# Patient Record
Sex: Female | Born: 1945 | Race: White | Hispanic: No | Marital: Married | Smoking: Never smoker
Health system: Southern US, Community
[De-identification: ages and names within clinical notes are randomized; demographics above are authoritative.]

## PROBLEM LIST (undated history)

## (undated) DIAGNOSIS — C786 Secondary malignant neoplasm of retroperitoneum and peritoneum: Secondary | ICD-10-CM

## (undated) DIAGNOSIS — I8390 Asymptomatic varicose veins of unspecified lower extremity: Secondary | ICD-10-CM

## (undated) HISTORY — PX: ABDOMINAL HYSTERECTOMY: SHX81

## (undated) HISTORY — PX: COLECTOMY: SHX59

## (undated) HISTORY — DX: Secondary malignant neoplasm of retroperitoneum and peritoneum: C78.6

## (undated) HISTORY — PX: APPENDECTOMY: SHX54

## (undated) HISTORY — DX: Asymptomatic varicose veins of unspecified lower extremity: I83.90

---

## 2004-10-06 ENCOUNTER — Ambulatory Visit: Payer: Self-pay | Admitting: Oncology

## 2004-10-26 ENCOUNTER — Encounter: Admission: RE | Admit: 2004-10-26 | Discharge: 2004-10-26 | Payer: Self-pay | Admitting: Oncology

## 2004-10-28 ENCOUNTER — Ambulatory Visit (HOSPITAL_COMMUNITY): Admission: RE | Admit: 2004-10-28 | Discharge: 2004-10-28 | Payer: Self-pay | Admitting: Oncology

## 2005-01-25 ENCOUNTER — Ambulatory Visit (HOSPITAL_BASED_OUTPATIENT_CLINIC_OR_DEPARTMENT_OTHER): Admission: RE | Admit: 2005-01-25 | Discharge: 2005-01-25 | Payer: Self-pay | Admitting: Orthopedic Surgery

## 2005-01-29 ENCOUNTER — Ambulatory Visit: Payer: Self-pay | Admitting: Oncology

## 2005-06-08 ENCOUNTER — Ambulatory Visit: Payer: Self-pay | Admitting: Oncology

## 2005-08-06 ENCOUNTER — Ambulatory Visit: Payer: Self-pay | Admitting: Oncology

## 2005-10-26 ENCOUNTER — Ambulatory Visit: Payer: Self-pay | Admitting: Oncology

## 2005-10-28 ENCOUNTER — Encounter: Admission: RE | Admit: 2005-10-28 | Discharge: 2005-10-28 | Payer: Self-pay | Admitting: Oncology

## 2006-02-11 ENCOUNTER — Ambulatory Visit: Payer: Self-pay | Admitting: Oncology

## 2006-02-15 LAB — COMPREHENSIVE METABOLIC PANEL
Alkaline Phosphatase: 37 U/L — ABNORMAL LOW (ref 39–117)
CO2: 25 mEq/L (ref 19–32)
Creatinine, Ser: 0.9 mg/dL (ref 0.4–1.2)
Glucose, Bld: 85 mg/dL (ref 70–99)
Sodium: 142 mEq/L (ref 135–145)
Total Bilirubin: 0.6 mg/dL (ref 0.3–1.2)

## 2006-02-15 LAB — CEA: CEA: 0.7 ng/mL (ref 0.0–5.0)

## 2006-02-15 LAB — LACTATE DEHYDROGENASE: LDH: 134 U/L (ref 94–250)

## 2006-05-03 ENCOUNTER — Ambulatory Visit: Payer: Self-pay | Admitting: Oncology

## 2006-05-10 LAB — COMPREHENSIVE METABOLIC PANEL
AST: 15 U/L (ref 0–37)
Albumin: 4.2 g/dL (ref 3.5–5.2)
Alkaline Phosphatase: 36 U/L — ABNORMAL LOW (ref 39–117)
BUN: 21 mg/dL (ref 6–23)
Potassium: 4.3 mEq/L (ref 3.5–5.3)
Sodium: 141 mEq/L (ref 135–145)
Total Protein: 6.6 g/dL (ref 6.0–8.3)

## 2006-05-10 LAB — CBC WITH DIFFERENTIAL/PLATELET
EOS%: 2.4 % (ref 0.0–7.0)
MCH: 33.4 pg (ref 26.0–34.0)
MCHC: 34.5 g/dL (ref 32.0–36.0)
MCV: 96.7 fL (ref 81.0–101.0)
MONO%: 8.7 % (ref 0.0–13.0)
RBC: 3.9 10*6/uL (ref 3.70–5.32)
RDW: 13 % (ref 11.3–14.5)

## 2006-05-10 LAB — LACTATE DEHYDROGENASE: LDH: 120 U/L (ref 94–250)

## 2006-07-05 ENCOUNTER — Ambulatory Visit: Admission: RE | Admit: 2006-07-05 | Discharge: 2006-07-05 | Payer: Self-pay | Admitting: Gynecologic Oncology

## 2006-07-05 ENCOUNTER — Other Ambulatory Visit: Admission: RE | Admit: 2006-07-05 | Discharge: 2006-07-05 | Payer: Self-pay | Admitting: Gynecologic Oncology

## 2006-07-05 ENCOUNTER — Encounter (INDEPENDENT_AMBULATORY_CARE_PROVIDER_SITE_OTHER): Payer: Self-pay | Admitting: Specialist

## 2006-10-04 LAB — HM COLONOSCOPY

## 2006-11-09 ENCOUNTER — Ambulatory Visit: Payer: Self-pay | Admitting: Oncology

## 2006-11-14 LAB — CBC WITH DIFFERENTIAL/PLATELET
Basophils Absolute: 0 10*3/uL (ref 0.0–0.1)
Eosinophils Absolute: 0.1 10*3/uL (ref 0.0–0.5)
HCT: 39 % (ref 34.8–46.6)
HGB: 13.9 g/dL (ref 11.6–15.9)
LYMPH%: 28.1 % (ref 14.0–48.0)
MONO#: 0.4 10*3/uL (ref 0.1–0.9)
NEUT%: 61.8 % (ref 39.6–76.8)
Platelets: 217 10*3/uL (ref 145–400)
WBC: 5.5 10*3/uL (ref 3.9–10.0)
lymph#: 1.5 10*3/uL (ref 0.9–3.3)

## 2006-11-15 LAB — CEA: CEA: 0.8 ng/mL (ref 0.0–5.0)

## 2006-11-15 LAB — COMPREHENSIVE METABOLIC PANEL
ALT: 13 U/L (ref 0–35)
BUN: 22 mg/dL (ref 6–23)
CO2: 26 mEq/L (ref 19–32)
Calcium: 9.1 mg/dL (ref 8.4–10.5)
Chloride: 108 mEq/L (ref 96–112)
Creatinine, Ser: 1.01 mg/dL (ref 0.40–1.20)
Glucose, Bld: 102 mg/dL — ABNORMAL HIGH (ref 70–99)
Total Bilirubin: 0.7 mg/dL (ref 0.3–1.2)

## 2006-11-15 LAB — LACTATE DEHYDROGENASE: LDH: 128 U/L (ref 94–250)

## 2006-12-21 ENCOUNTER — Encounter: Admission: RE | Admit: 2006-12-21 | Discharge: 2006-12-21 | Payer: Self-pay | Admitting: Oncology

## 2007-05-11 ENCOUNTER — Ambulatory Visit: Payer: Self-pay | Admitting: Oncology

## 2007-05-15 LAB — CEA: CEA: <0.5 ng/mL (ref 0.0–5.0)

## 2007-05-15 LAB — COMPREHENSIVE METABOLIC PANEL
ALT: 11 U/L (ref 0–35)
Albumin: 4.3 g/dL (ref 3.5–5.2)
CO2: 27 mEq/L (ref 19–32)
Calcium: 9.2 mg/dL (ref 8.4–10.5)
Chloride: 105 mEq/L (ref 96–112)
Potassium: 3.9 mEq/L (ref 3.5–5.3)
Sodium: 140 mEq/L (ref 135–145)
Total Protein: 6.8 g/dL (ref 6.0–8.3)

## 2007-05-15 LAB — CBC WITH DIFFERENTIAL/PLATELET
BASO%: 0.5 % (ref 0.0–2.0)
HCT: 37.2 % (ref 34.8–46.6)
MCHC: 35.5 g/dL (ref 32.0–36.0)
MONO#: 0.6 10*3/uL (ref 0.1–0.9)
NEUT#: 4 10*3/uL (ref 1.5–6.5)
RBC: 3.91 10*6/uL (ref 3.70–5.32)
WBC: 6.5 10*3/uL (ref 3.9–10.0)
lymph#: 1.7 10*3/uL (ref 0.9–3.3)

## 2007-05-15 LAB — LACTATE DEHYDROGENASE: LDH: 140 U/L (ref 94–250)

## 2007-05-15 LAB — CA 125: CA 125: 5 U/mL (ref 0.0–30.2)

## 2007-08-02 ENCOUNTER — Encounter: Payer: Self-pay | Admitting: Gynecologic Oncology

## 2007-08-02 ENCOUNTER — Other Ambulatory Visit: Admission: RE | Admit: 2007-08-02 | Discharge: 2007-08-02 | Payer: Self-pay | Admitting: Gynecologic Oncology

## 2007-08-02 ENCOUNTER — Ambulatory Visit: Admission: RE | Admit: 2007-08-02 | Discharge: 2007-08-02 | Payer: Self-pay | Admitting: Gynecologic Oncology

## 2007-11-17 ENCOUNTER — Ambulatory Visit: Payer: Self-pay | Admitting: Oncology

## 2007-11-21 LAB — CBC WITH DIFFERENTIAL/PLATELET
BASO%: 0.3 % (ref 0.0–2.0)
Basophils Absolute: 0 10*3/uL (ref 0.0–0.1)
HCT: 38.5 % (ref 34.8–46.6)
HGB: 13.5 g/dL (ref 11.6–15.9)
MONO#: 0.6 10*3/uL (ref 0.1–0.9)
NEUT#: 4 10*3/uL (ref 1.5–6.5)
NEUT%: 60.2 % (ref 39.6–76.8)
WBC: 6.7 10*3/uL (ref 3.9–10.0)
lymph#: 1.9 10*3/uL (ref 0.9–3.3)

## 2007-11-21 LAB — COMPREHENSIVE METABOLIC PANEL
ALT: 14 U/L (ref 0–35)
BUN: 27 mg/dL — ABNORMAL HIGH (ref 6–23)
CO2: 26 mEq/L (ref 19–32)
Calcium: 9.1 mg/dL (ref 8.4–10.5)
Chloride: 104 mEq/L (ref 96–112)
Creatinine, Ser: 0.81 mg/dL (ref 0.40–1.20)

## 2007-11-21 LAB — LACTATE DEHYDROGENASE: LDH: 118 U/L (ref 94–250)

## 2007-12-25 ENCOUNTER — Encounter: Admission: RE | Admit: 2007-12-25 | Discharge: 2007-12-25 | Payer: Self-pay | Admitting: Oncology

## 2008-03-11 ENCOUNTER — Ambulatory Visit: Payer: Self-pay | Admitting: Oncology

## 2008-03-11 LAB — CBC WITH DIFFERENTIAL/PLATELET
Basophils Absolute: 0 10*3/uL (ref 0.0–0.1)
EOS%: 2.8 % (ref 0.0–7.0)
Eosinophils Absolute: 0.2 10*3/uL (ref 0.0–0.5)
HCT: 39.3 % (ref 34.8–46.6)
HGB: 13.4 g/dL (ref 11.6–15.9)
MCH: 32.5 pg (ref 26.0–34.0)
NEUT%: 59.4 % (ref 39.6–76.8)
lymph#: 1.7 10*3/uL (ref 0.9–3.3)

## 2008-03-11 LAB — COMPREHENSIVE METABOLIC PANEL
AST: 17 U/L (ref 0–37)
BUN: 26 mg/dL — ABNORMAL HIGH (ref 6–23)
CO2: 25 mEq/L (ref 19–32)
Calcium: 9.3 mg/dL (ref 8.4–10.5)
Chloride: 107 mEq/L (ref 96–112)
Creatinine, Ser: 0.97 mg/dL (ref 0.40–1.20)
Glucose, Bld: 89 mg/dL (ref 70–99)

## 2008-03-11 LAB — LACTATE DEHYDROGENASE: LDH: 135 U/L (ref 94–250)

## 2008-03-11 LAB — CEA: CEA: <0.5 ng/mL (ref 0.0–5.0)

## 2008-03-11 LAB — CA 125: CA 125: 5.3 U/mL (ref 0.0–30.2)

## 2008-03-12 ENCOUNTER — Encounter: Admission: RE | Admit: 2008-03-12 | Discharge: 2008-03-12 | Payer: Self-pay | Admitting: Oncology

## 2008-05-17 ENCOUNTER — Ambulatory Visit: Payer: Self-pay | Admitting: Oncology

## 2008-05-21 LAB — CBC WITH DIFFERENTIAL/PLATELET
Eosinophils Absolute: 0.1 10*3/uL (ref 0.0–0.5)
HCT: 38.5 % (ref 34.8–46.6)
LYMPH%: 22.2 % (ref 14.0–48.0)
MONO#: 0.6 10*3/uL (ref 0.1–0.9)
NEUT#: 4.3 10*3/uL (ref 1.5–6.5)
NEUT%: 65.5 % (ref 39.6–76.8)
Platelets: 201 10*3/uL (ref 145–400)
RBC: 4.01 10*6/uL (ref 3.70–5.32)
WBC: 6.5 10*3/uL (ref 3.9–10.0)
lymph#: 1.4 10*3/uL (ref 0.9–3.3)

## 2008-05-21 LAB — COMPREHENSIVE METABOLIC PANEL
ALT: 11 U/L (ref 0–35)
Albumin: 4.2 g/dL (ref 3.5–5.2)
CO2: 26 mEq/L (ref 19–32)
Calcium: 9.3 mg/dL (ref 8.4–10.5)
Chloride: 105 mEq/L (ref 96–112)
Glucose, Bld: 86 mg/dL (ref 70–99)
Sodium: 140 mEq/L (ref 135–145)
Total Bilirubin: 0.5 mg/dL (ref 0.3–1.2)
Total Protein: 7 g/dL (ref 6.0–8.3)

## 2008-05-21 LAB — CA 125: CA 125: 4.7 U/mL (ref 0.0–30.2)

## 2008-05-21 LAB — CEA: CEA: <0.5 ng/mL (ref 0.0–5.0)

## 2008-05-21 LAB — LACTATE DEHYDROGENASE: LDH: 145 U/L (ref 94–250)

## 2008-11-25 ENCOUNTER — Ambulatory Visit: Payer: Self-pay | Admitting: Oncology

## 2008-11-25 LAB — CBC WITH DIFFERENTIAL/PLATELET
Basophils Absolute: 0 10*3/uL (ref 0.0–0.1)
Eosinophils Absolute: 0.1 10*3/uL (ref 0.0–0.5)
LYMPH%: 30.3 % (ref 14.0–49.7)
MCH: 32.4 pg (ref 25.1–34.0)
MONO%: 9.9 % (ref 0.0–14.0)
NEUT#: 3.1 10*3/uL (ref 1.5–6.5)
Platelets: 171 10*3/uL (ref 145–400)
RBC: 4.13 10*6/uL (ref 3.70–5.45)
RDW: 12.3 % (ref 11.2–14.5)
WBC: 5.5 10*3/uL (ref 3.9–10.3)
lymph#: 1.7 10*3/uL (ref 0.9–3.3)

## 2008-11-25 LAB — COMPREHENSIVE METABOLIC PANEL
AST: 19 U/L (ref 0–37)
CO2: 30 mEq/L (ref 19–32)
Calcium: 9 mg/dL (ref 8.4–10.5)
Glucose, Bld: 99 mg/dL (ref 70–99)
Total Protein: 7.1 g/dL (ref 6.0–8.3)

## 2008-11-25 LAB — CA 125: CA 125: 3.9 U/mL (ref 0.0–30.2)

## 2008-11-25 LAB — LACTATE DEHYDROGENASE: LDH: 109 U/L (ref 94–250)

## 2008-12-30 ENCOUNTER — Encounter: Admission: RE | Admit: 2008-12-30 | Discharge: 2008-12-30 | Payer: Self-pay | Admitting: Oncology

## 2009-01-03 ENCOUNTER — Encounter: Admission: RE | Admit: 2009-01-03 | Discharge: 2009-01-03 | Payer: Self-pay | Admitting: Oncology

## 2009-02-19 ENCOUNTER — Ambulatory Visit: Admission: RE | Admit: 2009-02-19 | Discharge: 2009-02-19 | Payer: Self-pay | Admitting: Gynecologic Oncology

## 2009-05-04 LAB — CONVERTED CEMR LAB

## 2009-05-14 ENCOUNTER — Ambulatory Visit: Payer: Self-pay | Admitting: Oncology

## 2009-05-16 LAB — COMPREHENSIVE METABOLIC PANEL
ALT: 15 U/L (ref 0–35)
Alkaline Phosphatase: 35 U/L — ABNORMAL LOW (ref 39–117)
BUN: 20 mg/dL (ref 6–23)
CO2: 25 mEq/L (ref 19–32)
Calcium: 9 mg/dL (ref 8.4–10.5)
Creatinine, Ser: 1.01 mg/dL (ref 0.40–1.20)
Potassium: 4 mEq/L (ref 3.5–5.3)
Sodium: 141 mEq/L (ref 135–145)

## 2009-05-16 LAB — CEA: CEA: 0.5 ng/mL (ref 0.0–5.0)

## 2009-05-16 LAB — CBC WITH DIFFERENTIAL/PLATELET
Eosinophils Absolute: 0.1 10*3/uL (ref 0.0–0.5)
HCT: 37.1 % (ref 34.8–46.6)
HGB: 12.9 g/dL (ref 11.6–15.9)
MCH: 33.5 pg (ref 25.1–34.0)
MCV: 96.5 fL (ref 79.5–101.0)
MONO%: 7 % (ref 0.0–14.0)
NEUT#: 3.9 10*3/uL (ref 1.5–6.5)
WBC: 6.1 10*3/uL (ref 3.9–10.3)
lymph#: 1.7 10*3/uL (ref 0.9–3.3)

## 2009-10-04 LAB — HM MAMMOGRAPHY

## 2009-12-31 ENCOUNTER — Encounter: Admission: RE | Admit: 2009-12-31 | Discharge: 2009-12-31 | Payer: Self-pay | Admitting: Oncology

## 2010-05-28 ENCOUNTER — Ambulatory Visit: Payer: Self-pay | Admitting: Oncology

## 2010-06-09 ENCOUNTER — Ambulatory Visit: Payer: Self-pay | Admitting: Family Medicine

## 2010-06-09 DIAGNOSIS — L255 Unspecified contact dermatitis due to plants, except food: Secondary | ICD-10-CM | POA: Insufficient documentation

## 2010-06-09 DIAGNOSIS — C786 Secondary malignant neoplasm of retroperitoneum and peritoneum: Secondary | ICD-10-CM

## 2010-06-09 HISTORY — DX: Secondary malignant neoplasm of retroperitoneum and peritoneum: C78.6

## 2010-06-29 ENCOUNTER — Ambulatory Visit: Payer: Self-pay | Admitting: Oncology

## 2010-07-01 ENCOUNTER — Encounter: Payer: Self-pay | Admitting: Family Medicine

## 2010-07-01 LAB — CBC WITH DIFFERENTIAL/PLATELET
EOS%: 2.6 % (ref 0.0–7.0)
Eosinophils Absolute: 0.2 10*3/uL (ref 0.0–0.5)
HCT: 37 % (ref 34.8–46.6)
HGB: 12.8 g/dL (ref 11.6–15.9)
MCV: 96.7 fL (ref 79.5–101.0)
NEUT#: 3.6 10*3/uL (ref 1.5–6.5)
Platelets: 193 10*3/uL (ref 145–400)

## 2010-07-01 LAB — COMPREHENSIVE METABOLIC PANEL
ALT: 14 U/L (ref 0–35)
Albumin: 4.1 g/dL (ref 3.5–5.2)
BUN: 18 mg/dL (ref 6–23)
Chloride: 107 mEq/L (ref 96–112)
Creatinine, Ser: 0.88 mg/dL (ref 0.40–1.20)
Glucose, Bld: 95 mg/dL (ref 70–99)
Potassium: 3.6 mEq/L (ref 3.5–5.3)
Sodium: 139 mEq/L (ref 135–145)
Total Bilirubin: 1.2 mg/dL (ref 0.3–1.2)

## 2010-07-01 LAB — CEA: CEA: 0.6 ng/mL (ref 0.0–5.0)

## 2010-07-01 LAB — CA 125: CA 125: 4.2 U/mL (ref 0.0–30.2)

## 2010-07-03 ENCOUNTER — Encounter: Payer: Self-pay | Admitting: Family Medicine

## 2010-08-19 ENCOUNTER — Ambulatory Visit
Admission: RE | Admit: 2010-08-19 | Discharge: 2010-08-19 | Payer: Self-pay | Source: Home / Self Care | Admitting: Gynecologic Oncology

## 2010-09-03 ENCOUNTER — Ambulatory Visit: Payer: Self-pay | Admitting: Internal Medicine

## 2010-09-07 ENCOUNTER — Ambulatory Visit: Payer: Self-pay | Admitting: Internal Medicine

## 2010-10-12 ENCOUNTER — Ambulatory Visit
Admission: RE | Admit: 2010-10-12 | Discharge: 2010-10-12 | Payer: Self-pay | Source: Home / Self Care | Attending: Internal Medicine | Admitting: Internal Medicine

## 2010-10-14 ENCOUNTER — Ambulatory Visit
Admission: RE | Admit: 2010-10-14 | Discharge: 2010-10-14 | Payer: Self-pay | Source: Home / Self Care | Attending: Family Medicine | Admitting: Family Medicine

## 2010-10-14 ENCOUNTER — Encounter: Payer: Self-pay | Admitting: Internal Medicine

## 2010-10-24 ENCOUNTER — Other Ambulatory Visit: Payer: Self-pay | Admitting: Oncology

## 2010-10-24 DIAGNOSIS — Z1231 Encounter for screening mammogram for malignant neoplasm of breast: Secondary | ICD-10-CM

## 2010-11-03 NOTE — Assessment & Plan Note (Signed)
Summary: NEW TO EST---POISON IVY//CCM   Vital Signs:  Patient profile:   65 year old female Menstrual status:  hysterectomy Height:      65 inches (165.10 cm) Weight:      135 pounds (61.36 kg) BMI:     22.55 O2 Sat:      97 % on Room air Temp:     97.8 degrees F (36.56 degrees C) oral Pulse rate:   66 / minute BP sitting:   110 / 68  (left arm)  Vitals Entered By: Josph Macho RMA (June 09, 2010 2:58 PM)  O2 Flow:  Room air CC: New patient to establish/ poison ivy X8 days - pt states she was seen at Urgent Care and received a shot and prednisone / CF Is Patient Diabetic? No     Menstrual Status hysterectomy Last PAP Result historical   History of Present Illness: Patient in for new patient appt. She is having a hard time getting rid of a poison ivy related rash that began 8 days ago. The first patches were on her right wrist and the right side of her face and she tried multiple over the counter remedies including Calamine, Benadryl, IvArrest and got only temporary relief. She ended up going to an Urgent Care facility and getting placed on Prednisone which she initially thought was helping but the last few nights she is waking up scratching with new patches on b/l flanks, arms and legs. The patch on her face has improved. She initially had been working in her yard but denies being out to work again. She tried to change her bedding and clean and to no avail. No fevers/chills/recent illness/congestion/CP/palp/SOB/GI or GU c/o.  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 She was first diagnosed in [place] where she underwent TAH b/l SPO, appy and partial colon resection. She has been cancer free since the diagnosis. She feels well and she had her last colonoscopy 3 years ago which she reports was normal. She sees Dr Si Gaul of GYN Oncology for her annual pelvic  and MGM and is scheduled in october for her exam. She had a bone scan once several years ago she reports was completely normal.  Preventive Screening-Counseling & Management  Alcohol-Tobacco     Smoking Status: never      Drug Use:  no.    Current Medications (verified): 1)  Prednisone 10 Mg Tabs (Prednisone) .... 3 Tabs Daily 2)  Climara 0.025 Mg/24hr Ptwk (Estradiol) .Marland Kitchen.. 1 Weekly 3)  Benadryl .... As Needed For Poison Ivy  Allergies (verified): 1)  ! Penicillin  Past History:  Past Surgical History: Appendectomy all 7 years ago for Cancer Colectomy Hysterectomy  Family History: Father: deceased 68, Lung Cancer, CAD, smoker Mother: 26, thyroid disease, macular degeneration, OA Siblings:  Brother: 73, CAD, former smoker Sister: 32, thyroid disease, goiter MGM: deceased in 30s, colon CA, OA MGF: deceased in late 34s, cancer PGM: deceased in 50s PGF: deceased in 60, MI Children: Son: 56, allergies  Social History: Occupation: part Dealer of events at [employer] Married Never Smoked Alcohol use-yes Drug use-no Wears seat belt regularlyOccupation:  employed Smoking Status:  never Drug Use:  no  Physical Exam  General:  Well-developed,well-nourished,in no acute distress; alert,appropriate and cooperative throughout examination Head:  Normocephalic and atraumatic without obvious abnormalities. No apparent alopecia or balding. Eyes:  No corneal or conjunctival inflammation noted. EOMI. Perrla. Funduscopic exam benign, without hemorrhages, exudates or papilledema. Vision grossly normal. Ears:  External ear exam shows no significant lesions or deformities.  Otoscopic examination reveals clear canals, tympanic membranes are intact bilaterally without bulging, retraction, inflammation or discharge. Hearing is grossly normal bilaterally. Nose:  External nasal examination shows no deformity or inflammation. Nasal mucosa are pink and moist without  lesions or exudates. Mouth:  Oral mucosa and oropharynx without lesions or exudates.  Teeth in good repair. Neck:  No deformities, masses, or tenderness noted. Lungs:  Normal respiratory effort, chest expands symmetrically. Lungs are clear to auscultation, no crackles or wheezes. Heart:  Normal rate and regular rhythm. S1 and S2 normal without gallop, murmur, click, rub or other extra sounds. Abdomen:  Bowel sounds positive,abdomen soft and non-tender without masses, organomegaly or hernias noted. Msk:  No deformity or scoliosis noted of thoracic or lumbar spine.   Pulses:  R and L carotid, dorsalis pedis and posterior tibial pulses are full and equal bilaterally Extremities:  No clubbing, cyanosis, edema, or deformity noted  Neurologic:  No cranial nerve deficits noted. Station and gait are normal. Plantar reflexes are down-going bilaterally. DTRs are symmetrical throughout. Sensory, motor and coordinative functions appear intact. Skin:  3 cm raised erythematous patch on right wrist, scattered smaller patches on right side of face, inner,  upper left thigh. Several brown seborrheic patches on her back, no black or irritated lesions noted Cervical Nodes:  No lymphadenopathy noted Psych:  Cognition and judgment appear intact. Alert and cooperative with normal attention span and concentration. No apparent delusions, illusions, hallucinations   Impression & Recommendations:  Problem # 1:  CONTACT DERMATITIS DUE TO POISON IVY (ICD-692.6)  Her updated medication list for this problem includes:    Prednisone 10 Mg Tabs (Prednisone) .Marland KitchenMarland KitchenMarland KitchenMarland Kitchen 3 tabs daily    Cetirizine Hcl 10 Mg Tabs (Cetirizine hcl) .Marland Kitchen... 1 tab by mouth two times a day as needed dermititis    Prednisone 5 Mg Tabs (Prednisone) .Marland Kitchen... 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 Takes the flu shot at church.  script given for her to have fasting labs done and sent to [place] when she does her labs for oncology and will repeat them with next years annual exam  Complete Medication List: 1)  Prednisone 10 Mg Tabs (Prednisone) .... 3 tabs daily 2)  Climara 0.025 Mg/24hr Ptwk (Estradiol) .Marland KitchenMarland KitchenMarland Kitchen 1 weekly 3)  Benadryl  .... As needed for poison ivy 4)  Cetirizine Hcl 10 Mg Tabs (Cetirizine hcl) .Marland Kitchen.. 1 tab by mouth two times a day as needed dermititis 5)  Prednisone 5 Mg Tabs (Prednisone) .Marland Kitchen.. 1 tab by mouth once daily x 3d then 1/2 tab by mouth once daily x 3 days use after course of 10mg  tabs completed  Patient Instructions: 1)  Please schedule a follow-up appointment in 1 year for annual exam 2)  or as needed for Shingles shot, if rash does not resolve or any  other concerns. 3)  For Dermititis, try [employer] as needed, add Distilled White Vinegar to laundry and may wipe down the dog as well. Clean environment including car and cut nails. Add Prednisone 5mg  tabs to end of 10mg  course. 4)  Need TSH, CBC, liver, renal, FLP with labs in fall ordered by oncology and again prior to next year's appt. May call for order prior to next years appt Prescriptions: PREDNISONE 5 MG TABS (PREDNISONE) 1 tab by mouth once daily x 3d then 1/2 tab by mouth once daily x 3 days use after course of 10mg  tabs completed  #6 x 0   Entered and Authorized by:   Danise Edge MD   Signed by:   Danise Edge MD on 06/09/2010   Method used:   Electronically to        [facility] (retail)       [address]       [place], [place]  [address]       Ph: [phone] or [phone]       Fax: [phone]   RxID:   [id number] CETIRIZINE HCL 10 MG TABS (CETIRIZINE HCL) 1 tab by mouth two times a day as needed dermititis  #60 x 2   Entered and Authorized by:   Danise Edge MD   Signed by:   Danise Edge MD on 06/09/2010   Method used:   Electronically to        [facility] (retail)       [address]       [place], [place]  [address]       Ph: [phone] or [phone]       Fax: [phone]   RxID:   [id number]   Preventive Care Screening  Mammogram:    Date:  10/04/2009    Results:  historical   Hemoccult:    Date:  05/04/2009    Results:  historical   Pap Smear:    Date:  05/04/2009    Results:  historical   Colonoscopy:    Date:  10/04/2006    Results:  historical   Bone Density:    Date:  10/04/2002    Results:  historical std dev

## 2010-11-03 NOTE — Letter (Signed)
Summary: [facility]  [facility]   Imported By: Maryln Gottron 07/23/2010 10:31:49  _____________________________________________________________________  External Attachment:    Type:   Image     Comment:   External Document

## 2010-11-03 NOTE — Assessment & Plan Note (Signed)
Summary: to complete health form/njr   Vital Signs:  Patient profile:   65 year old female Menstrual status:  hysterectomy Weight:      139 pounds Temp:     98.0 degrees F oral BP sitting:   110 / 70  (right arm) Cuff size:   regular  Vitals Entered By: Duard Brady LPN (September 03, 2010 9:22 AM) CC: form to be completed  Is Patient Diabetic? No  Vision Screening:Left eye with correction: 20 / 30 Right eye with correction: 20 / 30 Both eyes with correction: 20 / 30        Vision Entered By: Duard Brady LPN (September 03, 2010 9:32 AM)   CC:  form to be completed .  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  She has enjoyed excellent health and has no activity restrictions.  She is followed by oncology and has been clinically stable for a number of years.  She takes no chronic medications other than hormone replacement therapy  Preventive Screening-Counseling & Management  Alcohol-Tobacco     Smoking Status: never  Allergies: 1)  ! Penicillin  Physical Exam  General:  Well-developed,well-nourished,in no acute distress; alert,appropriate and cooperative throughout examination Head:  Normocephalic and atraumatic without obvious abnormalities. No apparent alopecia or balding. Eyes:  No corneal or conjunctival inflammation noted. EOMI. Perrla. Funduscopic exam benign, without hemorrhages, exudates or papilledema. Vision grossly normal. Ears:  External ear exam shows no significant lesions or deformities.  Otoscopic examination reveals clear canals, tympanic membranes are intact bilaterally without bulging, retraction, inflammation or discharge. Hearing is grossly normal bilaterally. Mouth:  Oral mucosa and oropharynx without lesions or exudates.  Teeth in good repair. Neck:  No deformities, masses, or tenderness noted. Lungs:  Normal  respiratory effort, chest expands symmetrically. Lungs are clear to auscultation, no crackles or wheezes. Heart:  Normal rate and regular rhythm. S1 and S2 normal without gallop, murmur, click, rub or other extra sounds. Abdomen:  Bowel sounds positive,abdomen soft and non-tender without masses, organomegaly or hernias noted. Msk:  No deformity or scoliosis noted of thoracic or lumbar spine.   Pulses:  R and L carotid,radial,femoral,dorsalis pedis and posterior tibial pulses are full and equal bilaterally Extremities:  No clubbing, cyanosis, edema, or deformity noted with normal full range of motion of all joints.     Impression & Recommendations:  Problem # 1:  HEALTH MAINTENANCE EXAM (ICD-V70.0)  Complete Medication List: 1)  Climara 0.025 Mg/24hr Ptwk (Estradiol) .Marland KitchenMarland KitchenMarland Kitchen 1 weekly 2)  Benadryl  .... As needed for poison ivy 3)  Cetirizine Hcl 10 Mg Tabs (Cetirizine hcl) .Marland Kitchen.. 1 tab by mouth two times a day as needed dermititis  Other Orders: TB Skin Test [id number]) Admin 1st Vaccine (86578)  Patient Instructions: 1)  Limit your Sodium (Salt). 2)  It is important that you exercise regularly at least 20 minutes 5 times a week. If you develop chest pain, have severe difficulty breathing, or feel very tired , stop exercising immediately and seek medical attention. 3)  return in 4 days to have your PPD skin test review   Orders Added: 1)  TB Skin Test [86580] 2)  Admin 1st Vaccine [90471] 3)  Est. Patient Level III [46962]   Immunization History:  Tetanus/Td Immunization History:    Tetanus/Td:  historical (11/04/2009)  Immunizations Administered:  PPD Skin Test:  Vaccine Type: PPD    Site: right forearm    Mfr: Sanofi Pasteur    Dose: 0.1 ml    Route: ID    Given by: Duard Brady LPN    Exp. Date: 08/06/2011    Lot #: [id number]    Physician counseled: yes   Immunization History:  Tetanus/Td Immunization History:    Tetanus/Td:  Historical  (11/04/2009)  Immunizations Administered:  PPD Skin Test:    Vaccine Type: PPD    Site: right forearm    Mfr: Sanofi Pasteur    Dose: 0.1 ml    Route: ID    Given by: Duard Brady LPN    Exp. Date: 08/06/2011    Lot #: [id number]    Physician counseled: yes

## 2010-11-05 NOTE — Assessment & Plan Note (Signed)
Summary: tb reading//alp  Nurse Visit   Vitals Entered By: Duard Brady LPN (October 14, 2010 10:53 AM)  Allergies: 1)  ! Penicillin  PPD Results    Date of reading: 10/14/2010    Results: < 5mm    Interpretation: negative

## 2010-11-05 NOTE — Assessment & Plan Note (Signed)
Summary: tb test//ccm  Nurse Visit   Allergies: 1)  ! Penicillin  Immunizations Administered:  PPD Skin Test:    Vaccine Type: PPD    Site: right forearm    Mfr: Sanofi Pasteur    Dose: 0.1 ml    Route: ID    Given by: Duard Brady LPN    Exp. Date: 08/06/2011    Lot #: [id number]    Physician counseled: yes  Orders Added: 1)  TB Skin Test [86580] 2)  Admin 1st Vaccine [90471]  pt was unable to return for ppd reading last month. KIK

## 2010-11-05 NOTE — Letter (Signed)
Summary: TB Skin Test  All     ,     Phone:   Fax:           TB Skin Test    [facility]    Date TB Test Placed:  ________________  L or R forearm  TB Test Placed by:  ___________________  Lot #:  __________________        Expiration Date: _____________  Date TB Test Read:  ____________________    Result ___________MM  TB Test Read by:  _______________

## 2010-11-05 NOTE — Letter (Signed)
Summary: TB Skin Test  All     ,     Phone:   Fax:           TB Skin Test    [name]    Date TB Test Placed:  ________________  L or R forearm  TB Test Placed by:  ___________________  Lot #:  __________________        Expiration Date: _____________  Date TB Test Read:  ____________________    Result ___________MM  TB Test Read by:  _______________ 

## 2011-01-04 ENCOUNTER — Ambulatory Visit: Payer: Self-pay

## 2011-01-06 ENCOUNTER — Ambulatory Visit
Admission: RE | Admit: 2011-01-06 | Discharge: 2011-01-06 | Disposition: A | Discharge status: Home / Self Care | Payer: BC Managed Care – PPO | Source: Ambulatory Visit | Attending: Oncology | Admitting: Oncology

## 2011-01-06 DIAGNOSIS — Z1231 Encounter for screening mammogram for malignant neoplasm of breast: Secondary | ICD-10-CM

## 2011-02-16 NOTE — Consult Note (Signed)
NAMEBERTHE, OLEY           ACCOUNT NO.:  [id number]   MEDICAL RECORD NO.:  [id number]          PATIENT TYPE:  OUT   LOCATION:  GYN                          FACILITY:  [facility]   PHYSICIAN:  Paola A. 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  She underwent aggressive surgical  debulking in [place] and has done well since that time with no evidence  of disease.  I last saw her in October of 2008 at which time her exam  was unremarkable.  She was seen by Dr. Cyndie Chime in February of 2010  at which time her exam was unremarkable, as was her review of systems.  In addition, her tumor markers were both negative with a CEA less than  0.5 and a CA-125 of 3.9.  She comes in today for followup.   REVIEW OF SYSTEMS:  She really offers no significant complaints.  She is  feeling well.  She did try going off her Climara for 3 months but had  significant vasomotor-type symptoms.  She would like a refill of that  today.  She denies any change in bowel or bladder habits, any nausea,  vomiting, early satiety, or abdominal bloating.  She has not been able  to acquire a new primary physician.  She and her husband have their  house on the market here and are thinking about living in [place] part  time and here in [place] part time.  She has not been exercising  as regularly she normally does and she states that is due to the cold  winter that we had.   HEALTH MAINTENANCE:  She is up to date on her mammogram.  She had a  colonoscopy 3 years ago.   MEDICATIONS:  Climara.   FAMILY HISTORY:  There are no new medical problems.   PHYSICAL EXAMINATION:  Weight 132 pounds.  Blood pressure 100/62.  Well-  nourished,  well-developed female in no acute distress.  NECK:  Supple.  There is no lymphadenopathy, no thyromegaly.  LUNGS:  Clear to auscultation bilaterally.  CARDIOVASCULAR:  Regular rate and rhythm.  ABDOMEN:  Shows a well-healed vertical midline incision.  There are no  incisional hernias.  Abdomen is soft, nontender, nondistended.  There  are no palpable masses or hepatosplenomegaly.  Groins are negative for  adenopathy.  EXTREMITIES:  There is no edema.  PELVIC:  External genitalia is within normal limits.  Bimanual  examination reveals no masses or nodularity.  Rectal confirms.   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   PLAN:  1. I wrote her for Climara 0.025.  Said she could start weaning      herself down.  She will call us if this does not      help with her vasomotor symptoms.  2. She will continue following up with Dr. Cyndie Chime as she is      doing.  She will return to see [place] in 1 year or p.r.n.  She was      given names of other primary care physicians.      Paola A. Duard Brady, MD  Electronically Signed     PAG/MEDQ  D:  02/19/2009  T:  02/19/2009  Job:  [id number]   cc:   Telford Nab, R.N.  [address]  [place], [place] [address]   Genene Churn. Cyndie Chime, M.D.  Fax: [phone]   Hildred Laser, M.D.  [address]  [place], [place] [address]

## 2011-02-16 NOTE — Consult Note (Signed)
[name], [name]           ACCOUNT NO.:  [id number]   MEDICAL RECORD NO.:  [id number]          PATIENT TYPE:  OUT   LOCATION:  GYN                          FACILITY:  [facility]   PHYSICIAN:  Paola A. Duard Brady, MD    DATE OF BIRTH:  [birth year]   DATE OF CONSULTATION:  08/02/2007  DATE OF DISCHARGE:                                 CONSULTATION   The patient is a 65 year old with a very interesting past medical  history.  In June 2004 she underwent TAH-BSO and  omentectomy and at  that time was noted to have mucinous ascites, a large 10-cm ovarian  mass, mucoid lesions on the ovaries.  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  Preoperative  tumor markers were elevated with the elevated CA-125 of 45.  She has  been followed by Dr. Cyndie Chime on a regular basis with negative CT  scans and PET scans. She has had no evidence of recurrent disease.  We  last saw her in October 2007 at which time her exam was negative as was  her Pap smear.  She comes in today for follow-up.  She was seen by Dr.  Cyndie Chime in August 2008 at which time her exam was also negative. In  his notes, he states that her CEA and CA-125 have been normal and he has  not been doing routine CAT scans.  She otherwise denies any complaints.   REVIEW OF SYSTEMS:  She denies any chest pain, shortness of breath,  nausea, vomiting, fevers, chills, headaches or visual changes.  She  denies any significant change in her bowel or bladder habits, any early  satiety, abdominal bloating, chest pain or shortness of breath.  She did  go off her Climara patch 3 weeks ago just to try it. She states she has  been on it for 10 years and wanted to see how she would feel. In the  past when she went off of it she would suffer from significant vasomotor  symptoms that is not occurring this time and she has been off of it  for  3 weeks. Medication list is reviewed and is unchanged.   FAMILY HISTORY:  There is no new medical problems.   HEALTH MAINTENANCE:  She is up-to-date on her mammograms.   PHYSICAL EXAMINATION:  Well-nourished, well-developed female in no acute  distress.  NECK:  Supple. There is no lymphadenopathy, no thyromegaly.  LUNGS:  Clear to auscultation bilaterally.  CARDIOVASCULAR:  Regular rate and rhythm.  BREASTS:  Symmetrical.  There is no palpable masses, no skin changes, no  nipple discharge and no axillary adenopathy.  ABDOMEN:  She has a well-healed vertical skin incision. There is no  evidence of an incisional hernia. Abdominal exam reveals no masses or  nodularity. There is no hepatosplenomegaly or fluid wave.  GROINS:  Negative for adenopathy.  EXTREMITIES:  There is no edema.  PELVIC:  External genitalia is within normal limits.  The vagina is  somewhat atrophic.  The vaginal cuff is visualized. There are no visible  lesions.  A ThinPrep Pap was done without difficulty.  Bimanual  examination reveals no masses or nodularity.  Rectal confirms.   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   PLAN:  Will followup the results of her Pap smear.  She will continue  her routine mammogram screening.  She will see Dr. Cyndie Chime as  scheduled and return to see [place] in 1 year or  p.r.n.      Paola A. Duard Brady, MD  Electronically Signed     PAG/MEDQ  D:  08/02/2007  T:  08/03/2007  Job:  [id number]

## 2011-02-19 NOTE — Op Note (Signed)
NAMEGIANNI, [name]           ACCOUNT NO.:  [id number]   MEDICAL RECORD NO.:  [id number]          PATIENT TYPE:  AMB   LOCATION:  [facility]                         FACILITY:  [facility]   PHYSICIAN:  Marlowe Kays, M.D.  DATE OF BIRTH:  [birth year]   DATE OF PROCEDURE:  01/25/2005  DATE OF DISCHARGE:                                 OPERATIVE REPORT   PREOPERATIVE DIAGNOSES:  1.  Torn medial meniscus.  2.  Possible tear, anterior cruciate ligament.  3.  Chondromalacia of the patella, left knee.   POSTOPERATIVE DIAGNOSES:  1.  Torn medial meniscus.  2.  Partial anterior cruciate ligament tear.  3.  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  4.  Middle shelf plica, left knee.   OPERATION:  1.  Left knee arthroscopy with one partial medial meniscectomy.  2.  Shaving of medial femoral condyle.  3.  Debridement of patella.  4.  Excision of medium shelf plica.   SURGEON:  Marlowe Kays, M.D.   ASSISTANT:  Nurse.   ANESTHESIA:  General.   INDICATIONS FOR PROCEDURE:  She injured her knee on January 13, 2005 playing  tennis.  MRI was performed on January 18, 2005, indicating torn medical  meniscus, probably CL tear and chondromalacia of the patella.  She is here  today for surgical treatment because of the above findings.   PROCEDURE:  Satisfactory general anesthesia.  Pneumonic tourniquet.  The leg  was esmarched out nonsterilely.  Thigh stabilizer.  Ace wrap and knee  protector on the right leg.  The left leg was prepped from thigh stabilizer  to the ankle with Duraprep and draped in a sterile field.  Superior medial  saline inflow.  First, the anterolateral portal of the medial compartment of  the knee joint was evaluated.  She was noted to have grade 2/4  chondromalacia of the posterior portion of the medial femoral condyle, which  I debrided off with a combination of scissors, baskets, and a 3.5 shaver  until smooth.  She had a bucket-handle-type tear of the  posterior third of  the medial meniscus.  With a probe, I could bring the meniscus into the  joint.  After ascertaining the extent of the tear, which went all the way  into the junction of the mid posterior third, I used arthroscopic scissors  to cut the meniscus tear through at this point and then piece-mealed it out  with baskets and a 3.5 shaver into the intercondylar area.  This left  remaining rim, which was stable on probing.  Final pictures were taken.  I  then looked at the medial gutter and suprapatellar area and found a large  medial shelf plica as well as a grade 2-3 chondromalacia of the mid patella,  which I shaved down until smooth.  I then reversed portals.  There were some  soft tissue projections extending from the ACL into the intercondylar area,  which I resected.  The lateral meniscus and joint appear to be normal.  Her  ACL on first glance appeared to be completely intact with a negative  anterior drawer sign.  On  probing, there did appear to be some partial  detachment from the femur, but it did appear also to be partially intact.  It is not felt that any debridement or further treatment was indicated.  The  knee joint was then irrigated until clear, and all fluid possible removed.  The two anterior portals were closed with 4-0 nylon.  Then 20 cc of 0.5%  Marcaine with Adrenaline and 4 mg of morphine were then distilled through  the inflow apparatus, which was removed, and this portal closed with 4-0  nylon as well.  Betadine Adaptic and dry sterile dressing were applied.  The  tourniquet was released.  She tolerated the procedure well and was taken to  the recovery room in satisfactory condition with no known complications.      JA/MEDQ  D:  01/25/2005  T:  01/25/2005  Job:  [id number]

## 2011-02-19 NOTE — Consult Note (Signed)
[name], [name]           ACCOUNT NO.:  [id number]   MEDICAL RECORD NO.:  [id number]          PATIENT TYPE:  OUT   LOCATION:  GYN                          FACILITY:  [facility]   PHYSICIAN:  Paola A. Duard Brady, MD    DATE OF BIRTH:  September 04, 1946   DATE OF CONSULTATION:  07/05/2006  DATE OF DISCHARGE:                                   CONSULTATION   REFERRING PHYSICIAN:  Genene Churn. Cyndie Chime, M.D.   The patient is seen today in consultation at the request of Dr. Cyndie Chime.  [name] is a 65 year old, gravida 1 para 1, who relocated from  [place].  In June 2004, she underwent TAH-BSO with omentectomy.  She at that  time was noted to have mucinous ascites, a large 10-cm ovarian mass, mucoid  lesions were noted on both ovaries.  The omentum was resected and she  underwent radical bilateral pelvic lymphadenectomy.  She has mucinous  lesions and implants noted in the right upper abdominal quadrant near the  right diaphragm, along the small bowel, and an area of the cul-de-sac on the  bladder.  A peritoneotomy was performed and all mucinous lesions were  removed.  The appendix appeared pathologic making mucinous fluid from its  tip and she underwent a right hemicolectomy with primary anastomosis.  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  Preoperative tumor  markers revealed a CA-125 of 45.  Since that time her tumor markers have all  been normal including her CEA and CA-125.  She has been followed by Dr.  Cyndie Chime on a regular basis with negative CT scans and PET scans.  The  patient had been following up with her doctor, Dr. Adela Glimpse, in [place] but  most recently she went down to see him and he was called for emergency  surgery, so she decided that she has relocated to this area that she should  relocate her physician, though she feels quite attached to her physician in  [place].  She did  have tumor markers when she was seen by Dr. Cyndie Chime in  August, she had a CEA of 0.8 and a CA-125 of 4.3.  She comes in today for  initial consultation visit.  She is overall doing quite well and denies any  complaints whatsoever.   PAST MEDICAL HISTORY:  As above.   MEDICATIONS:  Climara 0.5 patch.   ALLERGIES:  NONE.   PAST SURGICAL HISTORY:  As above.   PAST OBSTETRICAL HISTORY:  She is a gravida 1 para 1.  She had 1 spontaneous  vaginal delivery.   HEALTH MAINTENANCE:  She is up to date on her mammogram.  She is due in  January 2003.  She had a colonoscopy 3 years ago and they have recommended  every 5 year colonoscopies for her.   FAMILY HISTORY:  Her mother had macular degeneration.  Her father died at  the age of 19 with lung cancer.   PHYSICAL EXAMINATION:  VITAL SIGNS:  Weight 126 pounds, blood pressure  110/68, pulse 72.  GENERAL:  Well nourished, well developed  female in no acute distress.  NECK:  Supple.  There is no lymphadenopathy.  No thyromegaly.  LUNGS:  Clear to auscultation bilaterally.  CARDIOVASCULAR:  Regular rate and rhythm.  ABDOMEN:  Shows a well healed vertical skin incision.  The abdomen is soft,  nontender, nondistended.  There are no palpable masses or  hepatosplenomegaly.  Groins are negative for adenopathy.  EXTREMITIES:  There is no edema.  PELVIC:  External genitalia is within normal limits.  The vagina is  atrophic.  The vaginal cuff is visualized.  There are no visible lesions.  A  thin prep Pap was submitted without difficulty.  Bimanual examination  reveals no masses or nodularity.  Rectal confirms.   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   PLAN:  1. I think it would be reasonable for her to be seen by our service, once      a year.  She will continue her primary care though  however, with Dr.      Cyndie Chime.  He will see her a bi-yearly basis.  We will also assist      her in identifying a primary care physician in the area.  She was given      our card.  She knows that she can contact us in the future should the      need arise prior to her next appointment.  2. We will followup the results of her Pap smear and notify her of the      report.      Paola A. Duard Brady, MD  Electronically Signed     PAG/MEDQ  D:  07/05/2006  T:  07/06/2006  Job:  [id number]   cc:   Genene Churn. Cyndie Chime, M.D.  Fax: [phone]   Jolinda Croak, Dr.  Zoila Shutter [place], [place]   Barbara Cower [facility] [place], [place]   Telford Nab, R.N.  [phone] [address]  [place], [place] [address]

## 2011-04-05 ENCOUNTER — Encounter: Payer: Self-pay | Admitting: Internal Medicine

## 2011-04-06 ENCOUNTER — Ambulatory Visit: Payer: BC Managed Care – PPO | Admitting: Internal Medicine

## 2011-04-06 ENCOUNTER — Telehealth: Payer: Self-pay

## 2011-04-06 ENCOUNTER — Encounter: Payer: Self-pay | Admitting: Internal Medicine

## 2011-04-06 NOTE — Telephone Encounter (Signed)
Attempt to call [facility] for acute appt - appt made yesterday at 315pm. Called hm# - no ans no mach

## 2011-04-06 NOTE — Telephone Encounter (Signed)
Just recv'd 'call a nurse ' report - pt called and cx AM appt. KIK

## 2011-04-08 ENCOUNTER — Encounter: Payer: Self-pay | Admitting: Internal Medicine

## 2011-04-08 ENCOUNTER — Ambulatory Visit (INDEPENDENT_AMBULATORY_CARE_PROVIDER_SITE_OTHER): Payer: BC Managed Care – PPO | Admitting: Internal Medicine

## 2011-04-08 VITALS — BP 110/78 | Temp 97.8°F | Wt 132.0 lb

## 2011-04-08 DIAGNOSIS — J069 Acute upper respiratory infection, unspecified: Secondary | ICD-10-CM

## 2011-04-08 NOTE — Progress Notes (Signed)
  Subjective:    Patient ID: [name], female    DOB: 02-Mar-1946, 65 y.o.   MRN: [id number]  HPI   65 year old patient who presents with a one-week history of nasal congestion ear congestion and a sense of ringing in the ears.  No fever or purulent sinus drainage. Her chief complaint is congestion during the night and when she awakes in the morning. She has noticed some frontal sinus pressure. She has been using a nasal decongestant at bedtime. He has been using ibuprofen as well.    Review of Systems  Constitutional: Negative.   HENT: Positive for congestion, rhinorrhea and tinnitus. Negative for hearing loss, sore throat, dental problem and sinus pressure.   Eyes: Negative for pain, discharge and visual disturbance.  Respiratory: Negative for cough and shortness of breath.   Cardiovascular: Negative for chest pain, palpitations and leg swelling.  Gastrointestinal: Negative for nausea, vomiting, abdominal pain, diarrhea, constipation, blood in stool and abdominal distention.  Genitourinary: Negative for dysuria, urgency, frequency, hematuria, flank pain, vaginal bleeding, vaginal discharge, difficulty urinating, vaginal pain and pelvic pain.  Musculoskeletal: Negative for joint swelling, arthralgias and gait problem.  Skin: Negative for rash.  Neurological: Negative for dizziness, syncope, speech difficulty, weakness, numbness and headaches.  Hematological: Negative for adenopathy.  Psychiatric/Behavioral: Negative for behavioral problems, dysphoric mood and agitation. The patient is not nervous/anxious.        Objective:   Physical Exam  Constitutional: She is oriented to person, place, and time. She appears well-developed and well-nourished.  HENT:  Head: Normocephalic.  Right Ear: External ear normal.  Left Ear: External ear normal.  Mouth/Throat: Oropharynx is clear and moist.       Mild frontal sinus tenderness  Eyes: Conjunctivae and EOM are normal. Pupils are equal,  round, and reactive to light.  Neck: Normal range of motion. Neck supple. No thyromegaly present.  Cardiovascular: Normal rate, regular rhythm, normal heart sounds and intact distal pulses.   Pulmonary/Chest: Effort normal and breath sounds normal.  Abdominal: Soft. Bowel sounds are normal. She exhibits no mass. There is no tenderness.  Musculoskeletal: Normal range of motion.  Lymphadenopathy:    She has no cervical adenopathy.  Neurological: She is alert and oriented to person, place, and time.  Skin: Skin is warm and dry. No rash noted.  Psychiatric: She has a normal mood and affect. Her behavior is normal.          Assessment & Plan:    Viral URI. Will treat symptomatically

## 2011-04-08 NOTE — Patient Instructions (Signed)
Get plenty of rest, Drink lots of  clear liquids, and use Tylenol or ibuprofen for fever and discomfort.    Zutripro  1 teaspoon every 6 hours  Call or return to clinic prn if these symptoms worsen or fail to improve as anticipated.

## 2011-06-25 ENCOUNTER — Other Ambulatory Visit: Payer: Self-pay | Admitting: Oncology

## 2011-06-25 ENCOUNTER — Encounter (HOSPITAL_BASED_OUTPATIENT_CLINIC_OR_DEPARTMENT_OTHER): Payer: BC Managed Care – PPO | Admitting: Oncology

## 2011-06-25 DIAGNOSIS — C786 Secondary malignant neoplasm of retroperitoneum and peritoneum: Secondary | ICD-10-CM

## 2011-06-25 DIAGNOSIS — C181 Malignant neoplasm of appendix: Secondary | ICD-10-CM

## 2011-06-25 LAB — COMPREHENSIVE METABOLIC PANEL
Albumin: 4.2 g/dL (ref 3.5–5.2)
Alkaline Phosphatase: 41 U/L (ref 39–117)
BUN: 21 mg/dL (ref 6–23)
Glucose, Bld: 101 mg/dL — ABNORMAL HIGH (ref 70–99)
Total Bilirubin: 0.5 mg/dL (ref 0.3–1.2)

## 2011-06-25 LAB — CBC WITH DIFFERENTIAL/PLATELET
BASO%: 0.4 % (ref 0.0–2.0)
EOS%: 2.5 % (ref 0.0–7.0)
MCH: 31.8 pg (ref 25.1–34.0)
MCHC: 33.7 g/dL (ref 31.5–36.0)
MONO#: 0.5 10*3/uL (ref 0.1–0.9)
RBC: 4.06 10*6/uL (ref 3.70–5.45)
RDW: 12.7 % (ref 11.2–14.5)
WBC: 5.7 10*3/uL (ref 3.9–10.3)
lymph#: 1.7 10*3/uL (ref 0.9–3.3)
nRBC: 0 % (ref 0–0)

## 2011-06-25 LAB — CA 125: CA 125: 5.5 U/mL (ref 0.0–30.2)

## 2011-06-25 LAB — CEA: CEA: <0.5 ng/mL (ref 0.0–5.0)

## 2011-06-25 LAB — LACTATE DEHYDROGENASE: LDH: 139 U/L (ref 94–250)

## 2011-07-09 ENCOUNTER — Encounter (HOSPITAL_BASED_OUTPATIENT_CLINIC_OR_DEPARTMENT_OTHER): Payer: BC Managed Care – PPO | Admitting: Oncology

## 2011-07-09 DIAGNOSIS — C181 Malignant neoplasm of appendix: Secondary | ICD-10-CM

## 2011-07-09 DIAGNOSIS — C786 Secondary malignant neoplasm of retroperitoneum and peritoneum: Secondary | ICD-10-CM

## 2011-09-02 ENCOUNTER — Ambulatory Visit (INDEPENDENT_AMBULATORY_CARE_PROVIDER_SITE_OTHER): Payer: BC Managed Care – PPO | Admitting: Internal Medicine

## 2011-09-02 ENCOUNTER — Encounter: Payer: Self-pay | Admitting: Internal Medicine

## 2011-09-02 VITALS — BP 102/70 | HR 78 | Temp 98.2°F | Wt 134.0 lb

## 2011-09-02 DIAGNOSIS — J069 Acute upper respiratory infection, unspecified: Secondary | ICD-10-CM

## 2011-09-02 NOTE — Patient Instructions (Signed)
VIMOVO 1 twice daily  ZUTRIPRO 1 teaspoon every 6 hours for cough and congestion  Nasonex-- use daily

## 2011-09-02 NOTE — Progress Notes (Signed)
  Subjective:    Patient ID: [name], female    DOB: [birth year], 65 y.o.   MRN: [id number]  HPI  65 year old patient who presents with a four-day history of congestion and cough hoarseness fever chills and generalized myalgias. There's been no shortness or breath chest pain or purulent productive cough. Her most prominent symptom is nasal congestion. She has been using a nasal decongestant as well as a number of other over-the-counter products    Review of Systems  Constitutional: Positive for fever, chills and fatigue.  HENT: Positive for congestion, sore throat, rhinorrhea, voice change, sinus pressure and tinnitus.   Respiratory: Positive for cough. Negative for wheezing.        Objective:   Physical Exam  Constitutional: She is oriented to person, place, and time. She appears well-developed and well-nourished.  HENT:  Head: Normocephalic.  Right Ear: External ear normal.  Left Ear: External ear normal.  Mouth/Throat: Oropharynx is clear and moist. No oropharyngeal exudate (mild erythema).  Eyes: Conjunctivae and EOM are normal. Pupils are equal, round, and reactive to light.  Neck: Normal range of motion. Neck supple. No thyromegaly present.  Cardiovascular: Normal rate, regular rhythm, normal heart sounds and intact distal pulses.   Pulmonary/Chest: Effort normal and breath sounds normal. No respiratory distress. She has no wheezes. She has no rales.  Abdominal: Soft. Bowel sounds are normal. She exhibits no mass. There is no tenderness.  Musculoskeletal: Normal range of motion.  Lymphadenopathy:    She has no cervical adenopathy.  Neurological: She is alert and oriented to person, place, and time.  Skin: Skin is warm and dry. No rash noted.  Psychiatric: She has a normal mood and affect. Her behavior is normal.          Assessment & Plan:    Vital URI  We'll treat symptomatically. Samples of 5 Vimovo and zutripro dispensed. She will call if unimproved

## 2011-09-09 ENCOUNTER — Telehealth: Payer: Self-pay | Admitting: Internal Medicine

## 2011-09-09 MED ORDER — HYDROCODONE-HOMATROPINE 5-1.5 MG/5ML PO SYRP: 5.0000 mL | ORAL_SOLUTION | Freq: Four times a day (QID) | ORAL | Status: AC | PRN

## 2011-09-09 NOTE — Telephone Encounter (Signed)
ok 

## 2011-09-09 NOTE — Telephone Encounter (Signed)
Pt is still experiencing a cough and congestion and is requesting to have a refill on the cough med  [employer] [facility] [address]

## 2011-09-09 NOTE — Telephone Encounter (Signed)
Called into [facility] 

## 2011-09-09 NOTE — Telephone Encounter (Signed)
Last seen 11/29 - please advise

## 2011-11-11 ENCOUNTER — Ambulatory Visit (INDEPENDENT_AMBULATORY_CARE_PROVIDER_SITE_OTHER): Payer: BC Managed Care – PPO | Admitting: Internal Medicine

## 2011-11-11 ENCOUNTER — Encounter: Payer: Self-pay | Admitting: Internal Medicine

## 2011-11-11 VITALS — BP 104/68 | HR 70 | Temp 98.3°F | Wt 134.0 lb

## 2011-11-11 DIAGNOSIS — T148XXA Other injury of unspecified body region, initial encounter: Secondary | ICD-10-CM

## 2011-11-11 DIAGNOSIS — M25461 Effusion, right knee: Secondary | ICD-10-CM

## 2011-11-11 DIAGNOSIS — M25469 Effusion, unspecified knee: Secondary | ICD-10-CM

## 2011-11-11 NOTE — Patient Instructions (Signed)
Take Aleve 200 mg twice daily for pain or swelling  Call or return to clinic prn if these symptoms worsen or fail to improve as anticipated.   compresses to the right leg hematoma 4 times daily

## 2011-11-11 NOTE — Progress Notes (Signed)
  Subjective:    Patient ID: [name], female    DOB: [birth year], 66 y.o.   MRN: [id number]  HPI  66 year old patient who is seen today for followup. She was bit by her dog and has a persistent lump involving her right lower medial thigh. More recently he has also developed a slight right knee effusion.    Review of Systems  Musculoskeletal: Positive for joint swelling.  Skin: Positive for wound.       Objective:   Physical Exam  Musculoskeletal:       Appear to be a mild effusion involving the right knee but no other inflammatory changes  Skin:       The patient then had a 4 x 6 cm hematoma involving her medial aspect of the right lower thigh          Assessment & Plan:    Hematoma right thigh. Will apply warm compresses 4 times daily and take Aleve. Will call if this does not slowly improve over time  Suspected mild right knee effusion. If pain persists or swelling persists in spite of Aleve will refer her to orthopedics. No evidence of a septic joint

## 2012-02-16 ENCOUNTER — Other Ambulatory Visit: Payer: Self-pay | Admitting: Oncology

## 2012-02-16 DIAGNOSIS — Z1231 Encounter for screening mammogram for malignant neoplasm of breast: Secondary | ICD-10-CM

## 2012-03-22 ENCOUNTER — Ambulatory Visit
Admission: RE | Admit: 2012-03-22 | Discharge: 2012-03-22 | Disposition: A | Discharge status: Home / Self Care | Payer: BC Managed Care – PPO | Source: Ambulatory Visit | Attending: Oncology | Admitting: Oncology

## 2012-03-22 DIAGNOSIS — Z1231 Encounter for screening mammogram for malignant neoplasm of breast: Secondary | ICD-10-CM

## 2012-06-27 ENCOUNTER — Telehealth: Payer: Self-pay | Admitting: Oncology

## 2012-06-27 NOTE — Telephone Encounter (Signed)
Called pt , left message regarding appt on 07/21/12, r/s from am to PM per MD

## 2012-06-29 ENCOUNTER — Telehealth: Payer: Self-pay | Admitting: Oncology

## 2012-06-29 NOTE — Telephone Encounter (Signed)
lmonvm for pt re appt for 10/11 lb. Also confirmed 10/18 appt.

## 2012-07-14 ENCOUNTER — Other Ambulatory Visit: Payer: BC Managed Care – PPO | Admitting: Lab

## 2012-07-21 ENCOUNTER — Ambulatory Visit: Payer: BC Managed Care – PPO | Admitting: Oncology

## 2012-07-21 ENCOUNTER — Telehealth: Payer: Self-pay | Admitting: Oncology

## 2012-07-21 NOTE — Telephone Encounter (Signed)
Pt called and wants to r/s appt to January 2014 lab and MD , nurse notified

## 2012-07-26 ENCOUNTER — Telehealth: Payer: Self-pay | Admitting: *Deleted

## 2012-07-26 NOTE — Telephone Encounter (Signed)
msg left for pt that we are unable to call in refill as she has not been seen in the office since 08/2010.  She was advised to contact her primary care MD or call and make an appointment here.

## 2012-10-12 ENCOUNTER — Ambulatory Visit: Payer: BC Managed Care – PPO | Admitting: Gynecologic Oncology

## 2012-10-13 ENCOUNTER — Other Ambulatory Visit: Payer: Self-pay | Admitting: *Deleted

## 2012-10-13 ENCOUNTER — Other Ambulatory Visit (HOSPITAL_BASED_OUTPATIENT_CLINIC_OR_DEPARTMENT_OTHER): Payer: Medicare Other | Admitting: Lab

## 2012-10-13 DIAGNOSIS — C786 Secondary malignant neoplasm of retroperitoneum and peritoneum: Secondary | ICD-10-CM

## 2012-10-13 LAB — CBC WITH DIFFERENTIAL/PLATELET
Basophils Absolute: 0 10*3/uL (ref 0.0–0.1)
Eosinophils Absolute: 0.2 10*3/uL (ref 0.0–0.5)
HCT: 40.2 % (ref 34.8–46.6)
HGB: 13.2 g/dL (ref 11.6–15.9)
LYMPH%: 27.1 % (ref 14.0–49.7)
MCV: 97.5 fL (ref 79.5–101.0)
MONO#: 0.6 10*3/uL (ref 0.1–0.9)
MONO%: 8.4 % (ref 0.0–14.0)
NEUT#: 4.4 10*3/uL (ref 1.5–6.5)
NEUT%: 61.7 % (ref 38.4–76.8)
Platelets: 190 10*3/uL (ref 145–400)
WBC: 7.1 10*3/uL (ref 3.9–10.3)

## 2012-10-13 LAB — COMPREHENSIVE METABOLIC PANEL (CC13)
AST: 17 U/L (ref 5–34)
Albumin: 3.8 g/dL (ref 3.5–5.0)
BUN: 21 mg/dL (ref 7.0–26.0)
CO2: 25 mEq/L (ref 22–29)
Calcium: 9.1 mg/dL (ref 8.4–10.4)
Chloride: 105 mEq/L (ref 98–107)
Creatinine: 1 mg/dL (ref 0.6–1.1)
Potassium: 4.2 mEq/L (ref 3.5–5.1)

## 2012-10-13 LAB — LACTATE DEHYDROGENASE (CC13): LDH: 149 U/L (ref 125–245)

## 2012-10-14 LAB — CEA: CEA: <0.5 ng/mL (ref 0.0–5.0)

## 2012-10-20 ENCOUNTER — Ambulatory Visit (HOSPITAL_BASED_OUTPATIENT_CLINIC_OR_DEPARTMENT_OTHER): Payer: Medicare Other | Admitting: Oncology

## 2012-10-20 ENCOUNTER — Telehealth: Payer: Self-pay | Admitting: Oncology

## 2012-10-20 VITALS — BP 105/69 | HR 61 | Temp 97.6°F | Resp 18 | Ht 65.0 in | Wt 135.7 lb

## 2012-10-20 DIAGNOSIS — C786 Secondary malignant neoplasm of retroperitoneum and peritoneum: Secondary | ICD-10-CM

## 2012-10-20 NOTE — Telephone Encounter (Signed)
s.w. pt and advised on 6.20.14 mammo @ 2:00pm....pt ok

## 2012-10-20 NOTE — Telephone Encounter (Signed)
gv and printed appt scheduel for pt for Jan 2015.Marland KitchenMarland KitchenMarland KitchenMarland Kitchen

## 2012-10-22 ENCOUNTER — Encounter: Payer: Self-pay | Admitting: Oncology

## 2012-10-22 DIAGNOSIS — C786 Secondary malignant neoplasm of retroperitoneum and peritoneum: Secondary | ICD-10-CM

## 2012-10-22 HISTORY — DX: Secondary malignant neoplasm of retroperitoneum and peritoneum: C78.6

## 2012-10-22 NOTE — Progress Notes (Signed)
Hematology and Oncology Follow Up Visit  [name] [id number] 01/19/1946 67 y.o. 10/22/2012 10:40 AM   Principle Diagnosis: Encounter Diagnosis  Name Primary?  . SEC MALIG NEOPLASM RETROPERITONEUM&PERITONEUM Yes     Interim History:  Followup visit for this pleasant 67 year old woman with history of pseudomyxoma peritonei treated with surgery alone. She was evaluated in June of 2004 for what initially was felt to be an ovarian cyst.  She underwent a TAH/BSO with omentectomy at the [facility] in [place], [place] on 03/11/03.  Additional pathology was obvious at time of surgical exploration.  A small amount of mucinous ascites was found.  This was limited to the area of the cul-de-sac.  A large 10 cm ovarian mass was removed.  A smaller lesion was excised from the right ovary and tube.  Both of these ovaries were noted to have mucoid lesions in several areas.  Additional lesions were seen on the uterus.  The omentum was resected and appeared negative grossly.  A radical bilateral pelvic lymphadenectomy was done.  A number of mucinous lesions were seen in multiple areas in the right upper abdominal quadrant near the right diaphragm and along the small bowel and the areas in the cul-de-sac and on the bladder.  A peritoneotomy was performed.  All the mucinous lesions were meticulously identified and resected.  The appendix appeared pathologic, grossly dilated and leaking mucinous fluid from its tip.  A general surgeon was called in on the case and a right hemicolectomy was performed.  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  Thirteen pericolic lymph nodes were negative as were lymph nodes taken from the right and left pelvis.    Preop tumor markers showed mild elevation of CA-125, 45 units, normal less than 21, done 02/27/03.  Subsequent CEAs done postop were all undetectable, less than 0.5, done 04/04/03 and subsequent  levels also normal.    She has been followed by clinical exam, periodic CT scans and PET scans since diagnosis a year and a half ago, and there has been no gross evidence for recurrent disease.  Most recent CT scan report available prior to her moving to [facility] done 04/27/04 was unremarkable.  PET scan done one month after her surgery on 04/05/03 showed no focal abnormal uptake.    Ms. Bottcher remains asymptomatic at this time.  She denies any abdominal pain, distention, vaginal bleeding, or discharge.  No change in bowel habit.  She has diverticulosis and tends to be constipated.  She has done so well that I have no longer been getting routine CT scans. Last studies done through this office were done in June 2009 and remained stable with no evidence for recurrent disease. He  Medications: reviewed  Allergies:  Allergies  Allergen Reactions  . Penicillins     Review of Systems: Constitutional:  No constitutional symptoms  Respiratory: No cough or dyspnea Cardiovascular:  No chest pain or palpitations Gastrointestinal: See above Genito-Urinary: Ongoing followup with GYN oncology next appointment next month Musculoskeletal: No musculoskeletal complaints Neurologic: No headache or change in vision Skin: No skin changes Remaining ROS negative.  Physical Exam: Blood pressure 105/69, pulse 61, temperature 97.6 F (36.4 C), temperature source Oral, resp. rate 18, height 5\' 5"  (1.651 m), weight 135 lb 11.2 oz (61.553 kg). Wt Readings from Last 3 Encounters:  10/20/12 135 lb 11.2 oz (61.553 kg)  11/11/11 134 lb (60.782 kg)  09/02/11 134 lb (60.782 kg)     General appearance:  Thin pleasant, Caucasian woman HENNT: Pharynx no erythema or exudate  Lymph nodes: No adenopathy Breasts: Deferred to her gynecologist Lungs: Clear to auscultation resonant to percussion Heart: Regular rhythm no murmur Abdomen: Soft, nontender, no distention, no fluid wave, no mass, no organomegaly Extremities:  No edema, no calf tenderness Vascular: No cyanosis Neurologic: No focal deficit Skin: No rash or ecchymosis  Lab Results: Lab Results  Component Value Date   WBC 7.1 10/13/2012   HGB 13.2 10/13/2012   HCT 40.2 10/13/2012   MCV 97.5 10/13/2012   PLT 190 10/13/2012     Chemistry      Component Value Date/Time   NA 139 10/13/2012 1539   NA 142 06/25/2011 1341   NA 142 06/25/2011 1341   K 4.2 10/13/2012 1539   K 4.1 06/25/2011 1341   K 4.1 06/25/2011 1341   CL 105 10/13/2012 1539   CL 105 06/25/2011 1341   CL 105 06/25/2011 1341   CO2 25 10/13/2012 1539   CO2 25 06/25/2011 1341   CO2 25 06/25/2011 1341   BUN 21.0 10/13/2012 1539   BUN 21 06/25/2011 1341   BUN 21 06/25/2011 1341   CREATININE 1.0 10/13/2012 1539   CREATININE 0.95 06/25/2011 1341   CREATININE 0.95 06/25/2011 1341      Component Value Date/Time   CALCIUM 9.1 10/13/2012 1539   CALCIUM 9.1 06/25/2011 1341   CALCIUM 9.1 06/25/2011 1341   ALKPHOS 46 10/13/2012 1539   ALKPHOS 41 06/25/2011 1341   ALKPHOS 41 06/25/2011 1341   AST 17 10/13/2012 1539   AST 16 06/25/2011 1341   AST 16 06/25/2011 1341   ALT 8 10/13/2012 1539   ALT 9 06/25/2011 1341   ALT 9 06/25/2011 1341   BILITOT 0.60 10/13/2012 1539   BILITOT 0.5 06/25/2011 1341   BILITOT 0.5 06/25/2011 1341    CEA less than 0.5 on 10/13/2012; CA- 125-0.8 units   Radiological Studies: Most recent mammogram 03/22/2012 no pathology   Impression and Plan: #1. 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 She remains free of any obvious recurrence now out almost 10 years from diagnosis and surgery. I will continue to see her on an annual basis. She will call for any interim problems.  I will schedule her routine followup mammogram for this June. She will continue followup with Dr. Rica Records, gynecologic oncology   CC:. Dr. Rockney Ghee; Dr. Beverely Low   Levert Feinstein, MD 1/19/201410:40 AM

## 2012-11-08 ENCOUNTER — Ambulatory Visit: Payer: BC Managed Care – PPO | Admitting: Gynecologic Oncology

## 2012-11-14 ENCOUNTER — Other Ambulatory Visit (INDEPENDENT_AMBULATORY_CARE_PROVIDER_SITE_OTHER): Payer: Medicare Other

## 2012-11-14 DIAGNOSIS — Z0000 Encounter for general adult medical examination without abnormal findings: Secondary | ICD-10-CM

## 2012-11-14 DIAGNOSIS — C786 Secondary malignant neoplasm of retroperitoneum and peritoneum: Secondary | ICD-10-CM

## 2012-11-14 LAB — BASIC METABOLIC PANEL
BUN: 18 mg/dL (ref 6–23)
Chloride: 103 mEq/L (ref 96–112)
Creatinine, Ser: 1 mg/dL (ref 0.4–1.2)
GFR: 61.98 mL/min (ref >60.00)
Potassium: 4.7 mEq/L (ref 3.5–5.1)

## 2012-11-14 LAB — HEPATIC FUNCTION PANEL
Albumin: 4.2 g/dL (ref 3.5–5.2)
Bilirubin, Direct: 0 mg/dL (ref 0.0–0.3)
Total Bilirubin: 0.6 mg/dL (ref 0.3–1.2)

## 2012-11-14 LAB — LIPID PANEL
Cholesterol: 206 mg/dL — ABNORMAL HIGH (ref 0–200)
HDL: 67.8 mg/dL (ref >39.00)
Triglycerides: 76 mg/dL (ref 0.0–149.0)

## 2012-11-14 LAB — CBC WITH DIFFERENTIAL/PLATELET
Basophils Relative: 0.5 % (ref 0.0–3.0)
Eosinophils Relative: 2.2 % (ref 0.0–5.0)
Monocytes Relative: 7.5 % (ref 3.0–12.0)
Neutrophils Relative %: 63.5 % (ref 43.0–77.0)
Platelets: 191 10*3/uL (ref 150.0–400.0)
RBC: 4.37 Mil/uL (ref 3.87–5.11)
WBC: 6.3 10*3/uL (ref 4.5–10.5)

## 2012-11-14 LAB — TSH: TSH: 1.58 u[IU]/mL (ref 0.35–5.50)

## 2012-11-21 ENCOUNTER — Ambulatory Visit (INDEPENDENT_AMBULATORY_CARE_PROVIDER_SITE_OTHER): Payer: Medicare Other | Admitting: Internal Medicine

## 2012-11-21 ENCOUNTER — Encounter: Payer: Self-pay | Admitting: Internal Medicine

## 2012-11-21 VITALS — BP 100/62 | HR 54 | Temp 97.3°F | Resp 16 | Ht 64.75 in | Wt 134.0 lb

## 2012-11-21 DIAGNOSIS — Z0000 Encounter for general adult medical examination without abnormal findings: Secondary | ICD-10-CM

## 2012-11-21 DIAGNOSIS — C786 Secondary malignant neoplasm of retroperitoneum and peritoneum: Secondary | ICD-10-CM

## 2012-11-21 MED ORDER — ESTRADIOL 0.025 MG/24HR TD PTWK: 1.0000 | MEDICATED_PATCH | TRANSDERMAL | Status: DC

## 2012-11-21 NOTE — Patient Instructions (Signed)
    It is important that you exercise regularly, at least 20 minutes 3 to 4 times per week.  If you develop chest pain or shortness of breath seek  medical attention.   mammogram as scheduled  Take a calcium supplement, plus [phone] units of vitamin D

## 2012-11-21 NOTE — Progress Notes (Signed)
Subjective:    Patient ID: [name], female    DOB: [birth year], 67 y.o.   MRN: [id number]  HPI  67 year old patient who is seen today for a wellness exam. 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 She is doing quite well. She remains on hormone replacement therapy and has failed a number of attempts with discontinuation. No concerns or complaints today she did have screening colonoscopy in 2008 and is scheduled for mammogram later this spring.  1. Risk factors, based on past  M,S,F history-  cardiovascular risk factors  2.  Physical activities:   Remains quite active without exercise limitations  3.  Depression/mood: No history depression or mood disorder  4.  Hearing: No hearing deficits  5.  ADL's: Independent in all aspects of daily living  6.  Fall risk: Low  7.  Home safety: No problems identified  8.  Height weight, and visual acuity; height and weight stable no change in visual acuity. Uses contact lenses  9.  Counseling: Heart healthy diet regular exercise encouraged followup mammogram encouraged. This has been scheduled  10. Lab orders based on risk factors: Laboratory studies including lipid profile reviewed  11. Referral : Not appropriate at this time  12. Care plan: Heart healthy diet regular exercise encouraged  13. Cognitive assessment: Alert and oriented normal affect. No cognitive dysfunction       Review of Systems  Constitutional: Negative for fever, appetite change, fatigue and unexpected weight change.  HENT: Negative for hearing loss, ear pain, nosebleeds, congestion, sore throat, mouth sores, trouble swallowing, neck stiffness, dental problem, voice change, sinus pressure and tinnitus.   Eyes: Negative for photophobia, pain, redness and visual disturbance.  Respiratory: Negative for cough, chest tightness and shortness of breath.   Cardiovascular: Negative for chest pain,  palpitations and leg swelling.  Gastrointestinal: Negative for nausea, vomiting, abdominal pain, diarrhea, constipation, blood in stool, abdominal distention and rectal pain.  Genitourinary: Negative for dysuria, urgency, frequency, hematuria, flank pain, vaginal bleeding, vaginal discharge, difficulty urinating, genital sores, vaginal pain, menstrual problem and pelvic pain.  Musculoskeletal: Negative for back pain and arthralgias.  Skin: Negative for rash.  Neurological: Negative for dizziness, syncope, speech difficulty, weakness, light-headedness, numbness and headaches.  Hematological: Negative for adenopathy. Does not bruise/bleed easily.  Psychiatric/Behavioral: Negative for suicidal ideas, behavioral problems, self-injury, dysphoric mood and agitation. The patient is not nervous/anxious.        Objective:   Physical Exam  Constitutional: She is oriented to person, place, and time. She appears well-developed and well-nourished.  HENT:  Head: Normocephalic and atraumatic.  Right Ear: External ear normal.  Left Ear: External ear normal.  Mouth/Throat: Oropharynx is clear and moist.  Eyes: Conjunctivae and EOM are normal.  Neck: Normal range of motion. Neck supple. No JVD present. No thyromegaly present.  Cardiovascular: Normal rate, regular rhythm, normal heart sounds and intact distal pulses.   No murmur heard. Pulmonary/Chest: Effort normal and breath sounds normal. She has no wheezes. She has no rales.  Abdominal: Soft. Bowel sounds are normal. She exhibits no distension and no mass. There is no tenderness. There is no rebound and no guarding.  Musculoskeletal: Normal range of motion. She exhibits no edema and no tenderness.  Neurological: She is alert and oriented to person, place, and time. She has normal reflexes. No cranial nerve deficit. She exhibits normal muscle tone. Coordination normal.  Skin: Skin is warm and dry. No rash  noted.  Psychiatric: She has a normal mood and  affect. Her behavior is normal.          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

## 2013-03-23 ENCOUNTER — Ambulatory Visit
Admission: RE | Admit: 2013-03-23 | Discharge: 2013-03-23 | Disposition: A | Discharge status: Home / Self Care | Payer: Medicare Other | Source: Ambulatory Visit | Attending: Oncology | Admitting: Oncology

## 2013-03-23 ENCOUNTER — Ambulatory Visit: Payer: Medicare Other

## 2013-03-23 DIAGNOSIS — C786 Secondary malignant neoplasm of retroperitoneum and peritoneum: Secondary | ICD-10-CM

## 2013-09-07 ENCOUNTER — Encounter: Payer: Self-pay | Admitting: Internal Medicine

## 2013-09-07 ENCOUNTER — Ambulatory Visit (INDEPENDENT_AMBULATORY_CARE_PROVIDER_SITE_OTHER): Payer: Medicare Other | Admitting: Internal Medicine

## 2013-09-07 VITALS — BP 90/60 | HR 66 | Temp 98.0°F | Resp 18 | Wt 140.0 lb

## 2013-09-07 DIAGNOSIS — G5602 Carpal tunnel syndrome, left upper limb: Secondary | ICD-10-CM

## 2013-09-07 DIAGNOSIS — G5600 Carpal tunnel syndrome, unspecified upper limb: Secondary | ICD-10-CM

## 2013-09-07 MED ORDER — ESTRADIOL 0.025 MG/24HR TD PTWK: 0.0250 mg | MEDICATED_PATCH | TRANSDERMAL | Status: DC

## 2013-09-07 NOTE — Progress Notes (Signed)
Subjective:    Patient ID: [name], female    DOB: Nov 25, 1945, 67 y.o.   MRN: [id number]  HPI Pre-visit discussion using our clinic review tool. No additional management support is needed unless otherwise documented below in the visit note.  67 year old patient who presents with a ten-day history of tingling involving both hands left much greater than the right. She is left-handed and does spend much time at the computer and is also an avid Armed forces operational officer. She also describes some left neck and shoulder discomfort. She states that 12 years ago she did have what was felt to be a cervical radiculopathy and surgery was actually encouraged. She received chiropractic care and has done well.  Past Medical History  Diagnosis Date  . 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 Marital Status: Married    Spouse Name: N/A    Number of Children: N/A  . Years of Education: N/A   Occupational History  . Not on file.   Social History Main Topics  . Smoking status: Never Smoker   . Smokeless tobacco: Never Used  . Alcohol Use: Yes  . Drug Use: No  . Sexual Activity: Not on file   Other Topics Concern  . Not on file   Social History Narrative  . No narrative on file    Past Surgical History  Procedure Laterality Date  . Appendectomy    . Abdominal hysterectomy    . Colectomy      Family History  Problem Relation Age of Onset  . Thyroid disease Mother   . Arthritis Mother   . Cancer Father     lung ca , cad, smoker  . Heart disease Father     Allergies  Allergen Reactions  . Penicillins     Current Outpatient Prescriptions on File Prior to Visit  Medication Sig Dispense Refill  . ibuprofen (ADVIL) 200 MG tablet Take 400 mg by mouth every 6 (six) hours as needed for pain.       No current facility-administered medications on file  prior to visit.    BP 90/60  Pulse 66  Temp(Src) 98 F (36.7 C) (Oral)  Resp 18  Wt 140 lb (63.504 kg)  SpO2 97%       Review of Systems  Constitutional: Negative.   HENT: Negative for congestion, dental problem, hearing loss, rhinorrhea, sinus pressure, sore throat and tinnitus.   Eyes: Negative for pain, discharge and visual disturbance.  Respiratory: Negative for cough and shortness of breath.   Cardiovascular: Negative for chest pain, palpitations and leg swelling.  Gastrointestinal: Negative for nausea, vomiting, abdominal pain, diarrhea, constipation, blood in stool and abdominal distention.  Genitourinary: Negative for dysuria, urgency, frequency, hematuria, flank pain, vaginal bleeding, vaginal discharge, difficulty urinating, vaginal pain and pelvic pain.  Musculoskeletal: Negative for arthralgias, gait problem and joint swelling.  Skin: Negative for rash.  Neurological: Positive for numbness. Negative for dizziness, syncope, speech difficulty, weakness and headaches.  Hematological: Negative for adenopathy.  Psychiatric/Behavioral: Negative for behavioral problems, dysphoric mood and agitation. The patient is not nervous/anxious.        Objective:   Physical Exam  Constitutional: She appears well-developed and well-nourished. No distress.  Neck:  Full range of motion of the head and neck  Musculoskeletal: Normal range of motion.  Positive Tinel's Normal upper extremity reflexes and muscle strength  Assessment & Plan:   Suspect bilateral carpal tunnel syndrome left greater than the right. Information dispensed. Patient will consider a nocturnal wrist splint. We'll call if unimproved

## 2013-09-07 NOTE — Patient Instructions (Signed)
Carpal Tunnel Syndrome Carpal tunnel syndrome is a disorder of the nervous system in the wrist that causes pain, hand weakness, and/or loss of feeling. Carpal tunnel syndrome is caused by the compression, stretching, or irritation of the median nerve at the wrist joint. Athletes who experience carpal tunnel syndrome may notice a decrease in their performance to the condition, especially for sports that require strong hand or wrist action.  SYMPTOMS   Tingling, numbness, or burning pain in the hand or fingers.  Inability to sleep due to pain in the hand.  Sharp pains that shoot from the wrist up the arm or to the fingers, especially at night.  Morning stiffness or cramping of the hand.  Thumb weakness, resulting in difficulty holding objects or making a fist.  Shiny, dry skin on the hand.  Reduced performance in any sport requiring a strong grip. CAUSES   Median nerve damage at the wrist is caused by pressure due to swelling, inflammation, or scarred tissue.  Sources of pressure include:  Repetitive gripping or squeezing that causes inflammation of the tendon sheaths.  Scarring or shortening of the ligament that covers the median nerve.  Traumatic injury to the wrist or forearm such as fracture, sprain, or dislocation.  Prolonged hyperextension (wrist bent backward) or hyperflexion (wrist bent downward) of the wrist. RISK INCREASES WITH:  Diabetes mellitus.  Menopause or amenorrhea.  Rheumatoid arthritis.  Raynaud's disease.  Pregnancy.  Gout.  Kidney disease.  Ganglion cyst.  Repetitive hand or wrist action.  Hypothyroidism (underactive thyroid gland).  Repetitive jolting or shaking of the hands or wrist.  Prolonged forceful weight-bearing on the hands. PREVENTION  Bracing the hand and wrist straight during activities that involve repetitive grasping.  For activities that require prolonged extension of the wrist (bending towards the top of the forearm)  periodically change the position of your wrists.  Learn and use proper technique in activities that result in the wrist position in neutral to slight extension.  Avoid bending the wrist into full extension or flexion (up or down)  Keep the wrist in a straight (neutral) position. To keep the wrist in this position, wear a splint.  Avoid repetitive hand and wrist motions.  When possible avoid prolonged grasping of items (steering wheel of a car, a pen, a vacuum cleaner, or a rake).  Loosen your grip for activities that require prolonged grasping of items.  Place keyboards and writing surfaces at the correct height as to decrease strain on the wrist and hand.  Alternate work tasks to avoid prolonged wrist flexion.  Avoid pinching activities (needlework and writing) as they may irritate your carpal tunnel syndrome.  If these activities are necessary, complete them for shorter periods of time.  When writing, use a felt tip or roller ball pen and/or build up the grip on a pen to decrease the forces required for writing. PROGNOSIS  Carpal tunnel syndrome is usually curable with appropriate conservative treatment and sometimes resolves spontaneously. For some cases, surgery is necessary, especially if muscle wasting or nerve changes have developed.  RELATED COMPLICATIONS   Permanent numbness and a weak thumb or fingers in the affected hand.  Permanent paralysis of a portion of the hand and fingers. TREATMENT  Treatment initially consists of stopping activities that aggravate the symptoms as well as medication and ice to reduce inflammation. A wrist splint is often recommended for wear during activities of repetitive motion as well as at night. It is also important to learn and use proper technique   when performing activities that typically cause pain. On occasion, a corticosteroid injection may be given. If symptoms persist despite conservative treatment, surgery may be an option. Surgical  techniques free the pinched or compressed nerve. Carpal tunnel surgery is usually performed on an outpatient basis, meaning you go home the same day as surgery. These procedures provide almost complete relief of all symptoms in 95% of patients. Expect at least 2 weeks for healing after surgery. For cases that are the result of repeated jolting or shaking of the hand or wrist or prolonged hyperextension, surgery is not usually recommended, because stretching of the median nerve and not compression are usually the cause of carpal tunnel syndrome in these cases. MEDICATION   If pain medication is necessary, nonsteroidal anti-inflammatory medications, such as aspirin and ibuprofen, or other minor pain relievers, such as acetaminophen, are often recommended.  Do not take pain medication for 7 days before surgery.  Prescription pain relievers are usually only prescribed after surgery. Use only as directed and only as much as you need.  Corticosteroid injections may be given to reduce inflammation. However, they are not always recommended.  Vitamin B6 (pyridoxine) may reduce symptoms; use only if prescribed for your disorder. SEEK MEDICAL CARE IF:   Symptoms get worse or do not improve in 2 weeks despite treatment.  You also have a current or recent history of neck or shoulder injury that has resulted in pain or tingling elsewhere in your arm. Document Released: 09/20/2005 Document Revised: 01/15/2013 Document Reviewed: 01/02/2009 [employer] Patient Information 2014 [employer].  

## 2013-09-07 NOTE — Progress Notes (Signed)
Pre-visit discussion using our clinic review tool. No additional management support is needed unless otherwise documented below in the visit note.  

## 2013-10-15 ENCOUNTER — Other Ambulatory Visit (HOSPITAL_BASED_OUTPATIENT_CLINIC_OR_DEPARTMENT_OTHER): Payer: Commercial Managed Care - HMO

## 2013-10-15 DIAGNOSIS — C181 Malignant neoplasm of appendix: Secondary | ICD-10-CM

## 2013-10-15 DIAGNOSIS — C786 Secondary malignant neoplasm of retroperitoneum and peritoneum: Secondary | ICD-10-CM

## 2013-10-15 LAB — CBC WITH DIFFERENTIAL/PLATELET
BASO%: 0.8 % (ref 0.0–2.0)
Basophils Absolute: 0 10*3/uL (ref 0.0–0.1)
EOS ABS: 0.2 10*3/uL (ref 0.0–0.5)
EOS%: 2.9 % (ref 0.0–7.0)
HCT: 41.8 % (ref 34.8–46.6)
HGB: 14.2 g/dL (ref 11.6–15.9)
LYMPH#: 1.7 10*3/uL (ref 0.9–3.3)
LYMPH%: 30 % (ref 14.0–49.7)
MCH: 33.2 pg (ref 25.1–34.0)
MCHC: 33.9 g/dL (ref 31.5–36.0)
MCV: 98 fL (ref 79.5–101.0)
MONO#: 0.6 10*3/uL (ref 0.1–0.9)
MONO%: 9.9 % (ref 0.0–14.0)
NEUT%: 56.4 % (ref 38.4–76.8)
NEUTROS ABS: 3.1 10*3/uL (ref 1.5–6.5)
Platelets: 183 10*3/uL (ref 145–400)
RBC: 4.27 10*6/uL (ref 3.70–5.45)
RDW: 13.3 % (ref 11.2–14.5)
WBC: 5.6 10*3/uL (ref 3.9–10.3)

## 2013-10-15 LAB — COMPREHENSIVE METABOLIC PANEL (CC13)
ALT: 16 U/L (ref 0–55)
AST: 19 U/L (ref 5–34)
Albumin: 4.1 g/dL (ref 3.5–5.0)
Alkaline Phosphatase: 48 U/L (ref 40–150)
Anion Gap: 9 mEq/L (ref 3–11)
BUN: 20.1 mg/dL (ref 7.0–26.0)
CALCIUM: 9.6 mg/dL (ref 8.4–10.4)
CHLORIDE: 106 meq/L (ref 98–109)
CO2: 26 mEq/L (ref 22–29)
CREATININE: 0.9 mg/dL (ref 0.6–1.1)
Glucose: 79 mg/dl (ref 70–140)
Potassium: 4.9 mEq/L (ref 3.5–5.1)
SODIUM: 141 meq/L (ref 136–145)
Total Bilirubin: 0.7 mg/dL (ref 0.20–1.20)
Total Protein: 7.4 g/dL (ref 6.4–8.3)

## 2013-10-16 LAB — CEA

## 2013-10-16 LAB — CA 125: CA 125: 5.2 U/mL (ref 0.0–30.2)

## 2013-10-17 ENCOUNTER — Telehealth: Payer: Self-pay | Admitting: Internal Medicine

## 2013-10-17 DIAGNOSIS — C786 Secondary malignant neoplasm of retroperitoneum and peritoneum: Secondary | ICD-10-CM

## 2013-10-17 NOTE — Telephone Encounter (Signed)
Pt is seeing Dr. Murriel Hopper this Friday, pt needs a referral to Oncology. Please advise.

## 2013-10-18 NOTE — Telephone Encounter (Signed)
Left detailed message on pt's mobile Order done for referral to Oncology.

## 2013-10-19 ENCOUNTER — Ambulatory Visit (HOSPITAL_BASED_OUTPATIENT_CLINIC_OR_DEPARTMENT_OTHER): Payer: Medicare HMO | Admitting: Oncology

## 2013-10-19 VITALS — BP 105/66 | HR 75 | Temp 98.0°F | Resp 18 | Ht 64.75 in | Wt 136.7 lb

## 2013-10-19 DIAGNOSIS — C786 Secondary malignant neoplasm of retroperitoneum and peritoneum: Secondary | ICD-10-CM

## 2013-10-19 DIAGNOSIS — Z8589 Personal history of malignant neoplasm of other organs and systems: Secondary | ICD-10-CM

## 2013-10-19 DIAGNOSIS — Z85038 Personal history of other malignant neoplasm of large intestine: Secondary | ICD-10-CM

## 2013-10-19 MED ORDER — ESTRADIOL 0.025 MG/24HR TD PTWK: 0.0250 mg | MEDICATED_PATCH | TRANSDERMAL | Status: DC

## 2013-10-21 NOTE — Progress Notes (Signed)
Hematology and Oncology Follow Up Visit  [name] [id number] October 18, 1945 68 y.o. 10/21/2013 12:42 PM   Principle Diagnosis: Encounter Diagnosis  Name Primary?  . Pseudomyxoma peritonei Yes     Interim History:   Followup visit for this pleasant 68 year old woman with history of pseudomyxoma peritonei treated with surgery alone.  She was evaluated in June of 2004 for what initially was felt to be an ovarian cyst. She underwent a TAH/BSO with omentectomy at the [facility] in [place], [place] on 03/11/03. Additional pathology was obvious at time of surgical exploration. A small amount of mucinous ascites was found. This was limited to the area of the cul-de-sac. A large 10 cm ovarian mass was removed. A smaller lesion was excised from the right ovary and tube. Both of the ovaries were noted to have mucoid lesions in several areas. Additional lesions were seen on the uterus. The omentum was resected and appeared negative grossly. A radical bilateral pelvic lymphadenectomy was done. A number of mucinous lesions were seen in multiple areas in the right upper abdominal quadrant near the right diaphragm and along the small bowel and the areas in the cul-de-sac and on the bladder. A peritoneotomy was performed. All the mucinous lesions were meticulously identified and resected. The appendix appeared pathologic, grossly dilated and leaking mucinous fluid from its tip. A general surgeon was called in on the case and a right hemicolectomy was performed. 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 Thirteen pericolic lymph nodes were negative as were lymph nodes taken from the right and left pelvis.  Preop tumor markers showed mild elevation of CA-125, 45 units, normal less than 21, done 02/27/03. Subsequent CEAs done postop were all undetectable, less than 0.5, done 04/04/03 and subsequent levels also normal.  She was  followed by  clinical exam, periodic CT scans and PET scans since diagnosis   and there has been no gross evidence for recurrent disease. Most recent CT scan report available prior to her moving to [facility] done 04/27/04 was unremarkable. PET scan done one month after her surgery on 04/05/03 showed no focal abnormal uptake.  [name] remains asymptomatic at this time. She denies any abdominal pain, distention, vaginal bleeding, or discharge. No change in bowel habit. She has diverticulosis and tends to be constipated. She has done so well that I have no longer been getting routine CT scans. Last studies done through this office were done in June 2009 and have remained stable with no evidence for recurrent disease. I've been checking tumor markers on an annual basis both CEA and CA 125 and they remain undetectable including results done in anticipation of today's visit on 10/15/2013. The remainder of her laboratory studies are normal.  She has been getting annual pelvic exams by GYN oncology. At time of most recent visit she was told that she could transition to a general gynecologist since she is now in remission for over 10 years. I'm going to refer her to Dr. Aloha Gell.    Medications: reviewed  Allergies:  Allergies  Allergen Reactions  . Penicillins     Review of Systems: See history of present illness Remaining ROS negative.  Physical Exam: Blood pressure 105/66, pulse 75, temperature 98 F (36.7 C), temperature source Oral, resp. rate 18, height 5' 4.75" (1.645 m), weight 136 lb 11.2 oz (62.007 kg). Wt Readings from Last 3 Encounters:  10/19/13 136 lb 11.2 oz (62.007 kg)  09/07/13 140 lb (63.504 kg)  11/21/12  134 lb (60.782 kg)     General appearance: Well-nourished Caucasian woman HENNT: Pharynx no erythema, exudate, mass, or ulcer. No thyromegaly or thyroid nodules Lymph nodes: No cervical, supraclavicular, or axillary lymphadenopathy Breasts:  Lungs: Clear to auscultation,  resonant to percussion throughout Heart: Regular rhythm, no murmur, no gallop, no rub, no click, no edema Abdomen: Soft, nontender, normal bowel sounds, no mass, no organomegaly Extremities: No edema, no calf tenderness Musculoskeletal: no joint deformities GU: Recent pelvic exam by GYN oncologist reported as unremarkable post hysterectomy. Vascular: Carotid pulses 2+, no bruits,  Neurologic: Alert, oriented, PERRLA,  cranial nerves grossly normal, motor strength 5 over 5, reflexes 1+ symmetric, upper body coordination normal, gait normal, Skin: No rash or ecchymosis  Lab Results: CBC W/Diff    Component Value Date/Time   WBC 5.6 10/15/2013 0948   WBC 6.3 11/14/2012 0954   RBC 4.27 10/15/2013 0948   RBC 4.37 11/14/2012 0954   HGB 14.2 10/15/2013 0948   HGB 14.2 11/14/2012 0954   HCT 41.8 10/15/2013 0948   HCT 42.1 11/14/2012 0954   PLT 183 10/15/2013 0948   PLT 191.0 11/14/2012 0954   MCV 98.0 10/15/2013 0948   MCV 96.4 11/14/2012 0954   MCH 33.2 10/15/2013 0948   MCHC 33.9 10/15/2013 0948   MCHC 33.7 11/14/2012 0954   RDW 13.3 10/15/2013 0948   RDW 13.3 11/14/2012 0954   LYMPHSABS 1.7 10/15/2013 0948   LYMPHSABS 1.7 11/14/2012 0954   MONOABS 0.6 10/15/2013 0948   MONOABS 0.5 11/14/2012 0954   EOSABS 0.2 10/15/2013 0948   EOSABS 0.1 11/14/2012 0954   BASOSABS 0.0 10/15/2013 0948   BASOSABS 0.0 11/14/2012 0954     Chemistry      Component Value Date/Time   NA 141 10/15/2013 0950   NA 137 11/14/2012 0954   K 4.9 10/15/2013 0950   K 4.7 11/14/2012 0954   CL 103 11/14/2012 0954   CL 105 10/13/2012 1539   CO2 26 10/15/2013 0950   CO2 28 11/14/2012 0954   BUN 20.1 10/15/2013 0950   BUN 18 11/14/2012 0954   CREATININE 0.9 10/15/2013 0950   CREATININE 1.0 11/14/2012 0954      Component Value Date/Time   CALCIUM 9.6 10/15/2013 0950   CALCIUM 9.3 11/14/2012 0954   ALKPHOS 48 10/15/2013 0950   ALKPHOS 40 11/14/2012 0954   AST 19 10/15/2013 0950   AST 18 11/14/2012 0954   ALT 16 10/15/2013 0950   ALT 12  11/14/2012 0954   BILITOT 0.70 10/15/2013 0950   BILITOT 0.6 11/14/2012 0954    Mammograms on 03/23/2013 no pathology.   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  She remains free of any obvious recurrence now out over 10 years from diagnosis in June of 2004.  I will transition her care to Dr. Evlyn Clines. I will also make a GYN referral to Dr. Valentino Saxon.  CC: Patient Care Team: Marletta Lor, MD as PCP - General   Annia Belt, MD 1/18/201512:42 PM

## 2013-10-22 ENCOUNTER — Telehealth: Payer: Self-pay | Admitting: Internal Medicine

## 2013-10-22 DIAGNOSIS — C786 Secondary malignant neoplasm of retroperitoneum and peritoneum: Secondary | ICD-10-CM

## 2013-10-22 MED ORDER — ESTRADIOL 0.025 MG/24HR TD PTWK: 0.0250 mg | MEDICATED_PATCH | TRANSDERMAL | Status: DC

## 2013-10-22 NOTE — Telephone Encounter (Signed)
Rx  for estradiol (CLIMARA - DOSED IN MG/24 HR) 0.025 mg/24hr patch was sent to [facility] but should have been sent to [facility].  Please send to new pharmacy and confirm with patient.

## 2013-10-22 NOTE — Telephone Encounter (Signed)
Left detailed message Rx sent to pharmacy [facility].

## 2013-11-05 ENCOUNTER — Encounter: Payer: Self-pay | Admitting: Family Medicine

## 2013-11-05 ENCOUNTER — Ambulatory Visit (INDEPENDENT_AMBULATORY_CARE_PROVIDER_SITE_OTHER): Payer: Medicare HMO | Admitting: Family Medicine

## 2013-11-05 VITALS — BP 100/60 | HR 67 | Temp 97.5°F | Wt 140.0 lb

## 2013-11-05 DIAGNOSIS — J019 Acute sinusitis, unspecified: Secondary | ICD-10-CM

## 2013-11-05 MED ORDER — AZITHROMYCIN 250 MG PO TABS: ORAL_TABLET | ORAL | Status: AC

## 2013-11-05 MED ORDER — BENZONATATE 200 MG PO CAPS: 200.0000 mg | ORAL_CAPSULE | Freq: Three times a day (TID) | ORAL | Status: DC | PRN

## 2013-11-05 NOTE — Patient Instructions (Signed)

## 2013-11-05 NOTE — Progress Notes (Signed)
   Subjective:    Patient ID: [name], female    DOB: 1946/06/19, 68 y.o.   MRN: [id number]  HPI Acute visit. Patient's had 2 week history of cough which is mostly dry, nasal congestion, frontal sinus pressure and intermittent headaches especially early morning. She's had some nasal congestion but mostly postnasal drainage. She's had some low-grade nausea but no vomiting. No sore throat. No voice change. Nonsmoker. No shortness of breath. She's tried over-the-counter medications without much relief  Past Medical History  Diagnosis Date  . 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 Appendectomy    . Abdominal hysterectomy    . Colectomy      reports that she has never smoked. She has never used smokeless tobacco. She reports that she drinks alcohol. She reports that she does not use illicit drugs. family history includes Arthritis in her mother; Cancer in her father; Heart disease in her father; Thyroid disease in her mother. Allergies  Allergen Reactions  . Penicillins       Review of Systems  Constitutional: Positive for fatigue.  HENT: Positive for congestion.   Respiratory: Positive for cough.   Neurological: Positive for headaches.       Objective:   Physical Exam  Constitutional: She appears well-developed and well-nourished.  HENT:  Right Ear: External ear normal.  Left Ear: External ear normal.  Mouth/Throat: Oropharynx is clear and moist.  Neck: Neck supple.  Cardiovascular: Normal rate.   Pulmonary/Chest: Effort normal and breath sounds normal. No respiratory distress. She has no wheezes. She has no rales.  Lymphadenopathy:    She has no cervical adenopathy.          Assessment & Plan:  Acute upper respiratory infection. Suspect acute sinusitis. Given duration of symptoms, start Zithromax for  5 days. Tessalon Perles 200 mg every 8 hours as needed for cough.

## 2013-11-05 NOTE — Progress Notes (Signed)
Pre visit review using our clinic review tool, if applicable. No additional management support is needed unless otherwise documented below in the visit note. 

## 2013-11-08 ENCOUNTER — Telehealth: Payer: Self-pay | Admitting: Internal Medicine

## 2013-11-08 MED ORDER — HYDROCODONE-HOMATROPINE 5-1.5 MG/5ML PO SYRP: 5.0000 mL | ORAL_SOLUTION | Freq: Four times a day (QID) | ORAL | Status: DC | PRN

## 2013-11-08 NOTE — Telephone Encounter (Signed)
Patient Information:  Caller Name: [name]  Phone: [phone]  Patient: Kristie, [name]  Gender: Female  DOB: October 20, 1945  Age: 68 Years  PCP: [name] (Family Practice)  Office Follow Up:  Does the office need to follow up with this patient?: Yes  Instructions For The Office: see notes  RN Note:  Offered to schedule appt however pt would prefer to have message sent to MD to see if he thinks she really needs another appt or if there is anything else he would recommend. States she just wants to be able to stop coughing and be able to go to work. Assured her will send message and someone will f/u with her.  Symptoms  Reason For Call & Symptoms: Has had cold sxs x 2 wks. Seen in office on Monday 2/2 and prescribed Zpack and Tessalon pearls. Today is her 4th day taking the medications. C/o continued dry cough and headache and headache is made worse by the cough. Reports headache  on sides and back of head. Denies fever or nasal congestion. Does report fatigue an feels that one of the medications is making her mildly lightheaded intermittently.  Reviewed Health History In EMR: Yes  Reviewed Medications In EMR: Yes  Reviewed Allergies In EMR: Yes  Reviewed Surgeries / Procedures: Yes  Date of Onset of Symptoms: 10/25/2013  Treatments Tried: Zpack, Tessalon pearls  Treatments Tried Worked: No  Guideline(s) Used:  Colds  Cough  Disposition Per Guideline:   See Today or Tomorrow in Office  Reason For Disposition Reached:   Continuous (nonstop) coughing interferes with work or school and no improvement using cough treatment per Care Advice  Advice Given:  Call Back If:  Difficulty breathing  Cough lasts more than 3 weeks  You become worse.  Patient Will Follow Care Advice:  YES

## 2013-11-08 NOTE — Telephone Encounter (Signed)
Spoke to pt told her Dr. Raliegh Ip will give her cough syrup Hydromet with codeine in but has to pickup the prescription here at the office. Pt verbalized understanding. Rx printed and signed, put at the front desk.

## 2013-11-08 NOTE — Telephone Encounter (Signed)
Please call in a new prescription for Hydromet 6 ounces 1 teaspoon  every 6 hours as needed for cough and congestion

## 2013-12-03 ENCOUNTER — Encounter: Payer: Self-pay | Admitting: Oncology

## 2013-12-10 ENCOUNTER — Other Ambulatory Visit: Payer: Self-pay | Admitting: Oncology

## 2014-02-21 ENCOUNTER — Telehealth: Payer: Self-pay | Admitting: Internal Medicine

## 2014-02-21 DIAGNOSIS — Z1283 Encounter for screening for malignant neoplasm of skin: Secondary | ICD-10-CM

## 2014-02-21 NOTE — Telephone Encounter (Signed)
Referral placed.

## 2014-02-21 NOTE — Telephone Encounter (Signed)
Pt request referral to dermatolgy. Pt has some spots she would like to have a "body ck" .

## 2014-02-21 NOTE — Telephone Encounter (Signed)
Okay to refer? 

## 2014-02-21 NOTE — Telephone Encounter (Signed)
ok 

## 2014-03-29 ENCOUNTER — Telehealth: Payer: Self-pay | Admitting: Internal Medicine

## 2014-03-29 NOTE — Telephone Encounter (Signed)
Patient Information:  Caller Name: Almina  Phone: [phone]  Patient: [name], [name]  Gender: Female  DOB: [birth year]  Age: 68 Years  PCP: Carolann Littler (Family Practice)  Office Follow Up:  Does the office need to follow up with this patient?: Yes  Instructions For The Office: Please call and advise with referral   Symptoms  Reason For Call & Symptoms: Pt has a knee injury. She had called to see if she needed an x-ray or does she need to come in for an office visit. Pt had arthroscopy surgery  for a tear on this knee 8 years ago. She was playing tennis 1 week ago. She woke up last night in the middle of the night with sharp pains going through her leg.  Reviewed Health History In EMR: Yes  Reviewed Medications In EMR: Yes  Reviewed Allergies In EMR: Yes  Reviewed Surgeries / Procedures: Yes  Date of Onset of Symptoms: 03/28/2014  Guideline(s) Used:  Knee Pain  Disposition Per Guideline:   See Within 3 Days in Office  Reason For Disposition Reached:   Patient wants to be seen  Advice Given:  N/A  Patient Refused Recommendation:  Patient Will Follow Up With Office Later  Pt has requested an ortho referral without an office visit. Her insurance requires her to have one.

## 2014-04-01 ENCOUNTER — Encounter: Payer: Self-pay | Admitting: Internal Medicine

## 2014-04-01 ENCOUNTER — Ambulatory Visit (INDEPENDENT_AMBULATORY_CARE_PROVIDER_SITE_OTHER): Payer: Commercial Managed Care - HMO | Admitting: Internal Medicine

## 2014-04-01 VITALS — BP 100/60 | HR 55 | Temp 97.5°F | Resp 18 | Ht 64.75 in | Wt 138.0 lb

## 2014-04-01 DIAGNOSIS — M25569 Pain in unspecified knee: Secondary | ICD-10-CM

## 2014-04-01 DIAGNOSIS — M25561 Pain in right knee: Secondary | ICD-10-CM

## 2014-04-01 DIAGNOSIS — C786 Secondary malignant neoplasm of retroperitoneum and peritoneum: Secondary | ICD-10-CM

## 2014-04-01 MED ORDER — MELOXICAM 15 MG PO TABS: 15.0000 mg | ORAL_TABLET | Freq: Every day | ORAL | Status: DC

## 2014-04-01 NOTE — Telephone Encounter (Signed)
[name] for referral or office visit

## 2014-04-01 NOTE — Telephone Encounter (Signed)
Pt here for an appointment will discuss knee with Dr. Raliegh Ip.

## 2014-04-01 NOTE — Progress Notes (Signed)
Pre-visit discussion using our clinic review tool. No additional management support is needed unless otherwise documented below in the visit note.  

## 2014-04-01 NOTE — Patient Instructions (Addendum)
DATE OF PROCEDURE: 01/25/2005  DATE OF DISCHARGE:  OPERATIVE REPORT  PREOPERATIVE DIAGNOSES:  1. Torn medial meniscus.  2. Possible tear, anterior cruciate ligament.  3. Chondromalacia of the patella, left knee.  POSTOPERATIVE DIAGNOSES:  1. Torn medial meniscus.  2. Partial anterior cruciate ligament tear.  3. 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  4. Middle shelf plica, left knee.  OPERATION:  1. Left knee arthroscopy with one partial medial meniscectomy.  2. Shaving of medial femoral condyle.  3. Debridement of patella.  4. Excision of medium shelf plica.  Orthopedic follow up if unimprovedPatellofemoral Syndrome If you have had pain in the front of your knee for a long time, chances are good that you have patellofemoral syndrome. The word patella refers to the kneecap. Femoral (or femur) refers to the thigh bone. That is the bone the kneecap sits on. The kneecap is shaped like a triangle. Its job is to protect the knee and to improve the efficiency of your thigh muscles (quadriceps). The underside of the kneecap is made of smooth tissue (cartilage). This lets the kneecap slide up and down as the knee moves. Sometimes this cartilage becomes soft. Your healthcare provider may say the cartilage breaks down. That is patellofemoral syndrome. It can affect one knee, or both. The condition is sometimes called patellofemoral pain syndrome. That is because the condition is painful. The pain usually gets worse with activity. Sitting for a long time with the knee bent also makes the pain worse. It usually gets better with rest and proper treatment. CAUSES  No one is sure why some people develop this problem and others do not. Runners often get it. One name for the condition is "runner's knee." However, some people run for years and never have knee pain. Certain things seem to make patellofemoral syndrome more likely. They include:  Moving out of alignment. The kneecap is supposed  to move in a straight line when the thigh muscle pulls on it. Sometimes the kneecap moves in poor alignment. That can make the knee swell and hurt. Some experts believe it also wears down the cartilage.  Injury to the kneecap.  Strain on the knee. This may occur during sports activity. Soccer, running, skiing and cycling can put excess stress on the knee.  Being flat-footed or knock-kneed. SYMPTOMS   Knee pain.  Pain under the kneecap. This is usually a dull, aching pain.  Pain in the knee when doing certain things: squatting, kneeling, going up or down stairs.  Pain in the knee when you stand up after sitting down for awhile.  Tightness in the knee.  Loss of muscle strength in the thigh.  Swelling of the knee. DIAGNOSIS  Healthcare providers often send people with knee pain to an orthopedic caregiver. This person has special training to treat problems with bones and joints. To decide what is causing your knee pain, your caregiver will probably:  Do a physical exam. This will probably include:  Asking about symptoms you have noticed.  Asking about your activities and any injuries.  Feeling your knee. Moving it. This will help test the knee's strength. It will also check alignment (whether the knee and leg are aligned normally).  Order some tests, such as:  Imaging tests. They create pictures of the inside of the knee. Tests may include:  X-rays.  Computed tomography (CT) scan. This uses X-rays and a computer to show more detail.  Magnetic resonance imaging (MRI). This test uses magnets, radio waves and  a computer to make pictures. TREATMENT   Medication is almost always used first. It can relieve pain. It also can reduce swelling. Non-steroidal anti-inflammatory medicines (called NSAIDs) are usually suggested. Sometimes a stronger form is needed. A stronger form would require a prescription.  Other treatment may be needed after the swelling goes down. Possibilities  include:  Exercise. Certain exercises can make the muscles around the knee stronger which decreases the pressure on the knee cap. This includes the thigh muscle. Certain exercises also may be suggested to increase your flexibility.  A knee brace. This gives the knee extra support and helps align the movement of the knee cap.  Orthotics. These are special shoe inserts. They can help keep your leg and knee aligned.  Surgery is sometimes needed. This is rare. Options include:  Arthroscopy. The surgeon uses a special tool to remove any damaged pieces of the kneecap. Only a few small incisions (cuts) are needed.  Realignment. This is open surgery. The goals are to reduce pressure and fix the way the kneecap moves. HOME CARE INSTRUCTIONS   Take any medication prescribed by your healthcare provider. Follow the directions carefully.  If your knee is swollen:  Put ice or cold packs on it. Do this for 20 to 30 minutes, 3 to 4 times a day.  Keep the knee raised. Make sure it is supported. Put a pillow under it.  Rest your knee. For example, take the elevator instead of the stairs for awhile. Or, take a break from sports activity that strain your knee. Try walking or swimming instead.  Whenever you are active:  Use an elastic bandage on your knee. This gives it support.  After any activity, put ice or cold packs on your knees. Do this for about 10 to 20 minutes.  Make sure you wear shoes that give good support. Make sure they are not worn down. The heels should not slant in or out. SEEK MEDICAL CARE IF:   Knee pain gets worse. Or it does not go away, even after taking pain medicine.  Swelling does not go down.  Your thigh muscle becomes weak.  You have an oral temperature above 102 F (38.9 C). SEEK IMMEDIATE MEDICAL CARE IF:  You have an oral temperature above 102 F (38.9 C), not controlled by medicine. Document Released: 09/08/2009 Document Revised: 12/13/2011 Document Reviewed:  09/08/2009 [facility] Patient Information 2015 [place], [place]. This information is not intended to replace advice given to you by your health care provider. Make sure you discuss any questions you have with your health care provider.

## 2014-04-01 NOTE — Telephone Encounter (Signed)
Left message on voicemail to call office.  

## 2014-04-01 NOTE — Progress Notes (Signed)
Subjective:    Patient ID: [name], female    DOB: 1945/12/29, 68 y.o.   MRN: [id number]  HPI  68 year old patient who presents with a two-week history of right medial knee pain.  She had arthroscopic surgery performed by Dr. Collier Salina  8 or 9 years ago.  She has remained very active with tennis and other activities , but no acute injury.  She has been curtailing her activities and use of Advil, but has persistent right medial knee pain She also complains of a 4 to five-day history of sinus congestion, sore throat, and ear discomfort.  DATE OF PROCEDURE: 01/25/2005  DATE OF DISCHARGE:  OPERATIVE REPORT  PREOPERATIVE DIAGNOSES:  1. Torn medial meniscus.  2. Possible tear, anterior cruciate ligament.  3. Chondromalacia of the patella, left knee.  POSTOPERATIVE DIAGNOSES:  1. Torn medial meniscus.  2. Partial anterior cruciate ligament tear.  3. 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  4. Middle shelf plica, left knee.  OPERATION:  1. Left knee arthroscopy with one partial medial meniscectomy.  2. Shaving of medial femoral condyle.  3. Debridement of patella.  4. Excision of medium shelf plica.   Past Medical History  Diagnosis Date  . 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 Marital Status: Married    Spouse Name: N/A    Number of Children: N/A  . Years of Education: N/A   Occupational History  . Not on file.   Social History Main Topics  . Smoking status: Never Smoker   . Smokeless tobacco: Never Used  . Alcohol Use: Yes  . Drug Use: No  . Sexual Activity: Not on file   Other Topics Concern  . Not on file   Social History Narrative  . No narrative on file    Past Surgical History  Procedure Laterality Date  . Appendectomy    . Abdominal hysterectomy    . Colectomy      Family History  Problem  Relation Age of Onset  . Thyroid disease Mother   . Arthritis Mother   . Cancer Father     lung ca , cad, smoker  . Heart disease Father     Allergies  Allergen Reactions  . Penicillins     Current Outpatient Prescriptions on File Prior to Visit  Medication Sig Dispense Refill  . estradiol (CLIMARA - DOSED IN MG/24 HR) 0.025 mg/24hr patch Place 1 patch (0.025 mg total) onto the skin once a week.  12 patch  3  . ibuprofen (ADVIL) 200 MG tablet Take 400 mg by mouth every 6 (six) hours as needed for pain.       No current facility-administered medications on file prior to visit.    BP 100/60  Pulse 55  Temp(Src) 97.5 F (36.4 C) (Oral)  Resp 18  Ht 5' 4.75" (1.645 m)  Wt 138 lb (62.596 kg)  BMI 23.13 kg/m2  SpO2 97%       Review of Systems  Constitutional: Negative.   HENT: Positive for ear pain, postnasal drip, rhinorrhea and sinus pressure. Negative for congestion, dental problem, hearing loss, sore throat and tinnitus.   Eyes: Negative for pain, discharge and visual disturbance.  Respiratory: Negative for cough and shortness of breath.   Cardiovascular: Negative for chest pain, palpitations and leg swelling.  Gastrointestinal: Negative for nausea, vomiting, abdominal pain, diarrhea,  constipation, blood in stool and abdominal distention.  Genitourinary: Negative for dysuria, urgency, frequency, hematuria, flank pain, vaginal bleeding, vaginal discharge, difficulty urinating, vaginal pain and pelvic pain.  Musculoskeletal: Positive for arthralgias. Negative for gait problem and joint swelling.  Skin: Negative for rash.  Neurological: Negative for dizziness, syncope, speech difficulty, weakness, numbness and headaches.  Hematological: Negative for adenopathy.  Psychiatric/Behavioral: Negative for behavioral problems, dysphoric mood and agitation. The patient is not nervous/anxious.        Objective:   Physical Exam  Constitutional: She is oriented to person, place,  and time. She appears well-developed and well-nourished.  HENT:  Head: Normocephalic.  Right Ear: External ear normal.  Left Ear: External ear normal.  Mouth/Throat: Oropharynx is clear and moist.  ENT- normal  Eyes: Conjunctivae and EOM are normal. Pupils are equal, round, and reactive to light.  Neck: Normal range of motion. Neck supple. No thyromegaly present.  Cardiovascular: Normal rate, regular rhythm, normal heart sounds and intact distal pulses.   Pulmonary/Chest: Effort normal and breath sounds normal.  Abdominal: Soft. Bowel sounds are normal. She exhibits no mass. There is no tenderness.  Musculoskeletal: Normal range of motion.  Right knee with full extension No effusion No signs of active inflammation No focal tenderness  Lymphadenopathy:    She has no cervical adenopathy.  Neurological: She is alert and oriented to person, place, and time.  Skin: Skin is warm and dry. No rash noted.  Psychiatric: She has a normal mood and affect. Her behavior is normal.          Assessment & Plan:   Mild URI.  Continue symptomatic treatment Right knee pain.  Trial Mobic 15.  Orthopedic referral if unimproved - Possible patellofemoral symdrome

## 2014-04-01 NOTE — Telephone Encounter (Signed)
Please see note and advise  

## 2014-05-23 ENCOUNTER — Encounter: Payer: Self-pay | Admitting: Internal Medicine

## 2014-05-23 ENCOUNTER — Ambulatory Visit (INDEPENDENT_AMBULATORY_CARE_PROVIDER_SITE_OTHER): Payer: Commercial Managed Care - HMO | Admitting: Internal Medicine

## 2014-05-23 VITALS — BP 100/60 | HR 55 | Temp 97.3°F | Resp 18 | Ht 64.75 in | Wt 135.0 lb

## 2014-05-23 DIAGNOSIS — I8391 Asymptomatic varicose veins of right lower extremity: Secondary | ICD-10-CM

## 2014-05-23 DIAGNOSIS — I8390 Asymptomatic varicose veins of unspecified lower extremity: Secondary | ICD-10-CM

## 2014-05-23 NOTE — Patient Instructions (Signed)
Muscle Cramps and Spasms Muscle cramps and spasms occur when a muscle or muscles tighten and you have no control over this tightening (involuntary muscle contraction). They are a common problem and can develop in any muscle. The most common place is in the calf muscles of the leg. Both muscle cramps and muscle spasms are involuntary muscle contractions, but they also have differences:   Muscle cramps are sporadic and painful. They may last a few seconds to a quarter of an hour. Muscle cramps are often more forceful and last longer than muscle spasms.  Muscle spasms may or may not be painful. They may also last just a few seconds or much longer. CAUSES  It is uncommon for cramps or spasms to be due to a serious underlying problem. In many cases, the cause of cramps or spasms is unknown. Some common causes are:   Overexertion.   Overuse from repetitive motions (doing the same thing over and over).   Remaining in a certain position for a long period of time.   Improper preparation, form, or technique while performing a sport or activity.   Dehydration.   Injury.   Side effects of some medicines.   Abnormally low levels of the salts and ions in your blood (electrolytes), especially potassium and calcium. This could happen if you are taking water pills (diuretics) or you are pregnant.  Some underlying medical problems can make it more likely to develop cramps or spasms. These include, but are not limited to:   Diabetes.   Parkinson disease.   Hormone disorders, such as thyroid problems.   Alcohol abuse.   Diseases specific to muscles, joints, and bones.   Blood vessel disease where not enough blood is getting to the muscles.  HOME CARE INSTRUCTIONS   Stay well hydrated. Drink enough water and fluids to keep your urine clear or pale yellow.  It may be helpful to massage, stretch, and relax the affected muscle.  For tight or tense muscles, use a warm towel, heating  pad, or hot shower water directed to the affected area.  If you are sore or have pain after a cramp or spasm, applying ice to the affected area may relieve discomfort.  Put ice in a plastic bag.  Place a towel between your skin and the bag.  Leave the ice on for 15-20 minutes, 03-04 times a day.  Medicines used to treat a known cause of cramps or spasms may help reduce their frequency or severity. Only take over-the-counter or prescription medicines as directed by your caregiver. SEEK MEDICAL CARE IF:  Your cramps or spasms get more severe, more frequent, or do not improve over time.  MAKE SURE YOU:   Understand these instructions.  Will watch your condition.  Will get help right away if you are not doing well or get worse. Document Released: 03/12/2002 Document Revised: 01/15/2013 Document Reviewed: 09/06/2012 [facility] Patient Information 2015 [place], [place]. This information is not intended to replace advice given to you by your health care provider. Make sure you discuss any questions you have with your health care provider. Varicose Veins Varicose veins are veins that have become enlarged and twisted. CAUSES This condition is the result of valves in the veins not working properly. Valves in the veins help return blood from the leg to the heart. If these valves are damaged, blood flows backwards and backs up into the veins in the leg near the skin. This causes the veins to become larger. People who are on  their feet a lot, who are pregnant, or who are overweight are more likely to develop varicose veins. SYMPTOMS   Bulging, twisted-appearing, bluish veins, most commonly found on the legs.  Leg pain or a feeling of heaviness. These symptoms may be worse at the end of the day.  Leg swelling.  Skin color changes. DIAGNOSIS  Varicose veins can usually be diagnosed with an exam of your legs by your caregiver. He or she may recommend an ultrasound of your leg veins. TREATMENT    Most varicose veins can be treated at home.However, other treatments are available for people who have persistent symptoms or who want to treat the cosmetic appearance of the varicose veins. These include:  Laser treatment of very small varicose veins.  Medicine that is shot (injected) into the vein. This medicine hardens the walls of the vein and closes off the vein. This treatment is called sclerotherapy. Afterwards, you may need to wear clothing or bandages that apply pressure.  Surgery. HOME CARE INSTRUCTIONS   Do not stand or sit in one position for long periods of time. Do not sit with your legs crossed. Rest with your legs raised during the day.  Wear elastic stockings or support hose. Do not wear other tight, encircling garments around the legs, pelvis, or waist.  Walk as much as possible to increase blood flow.  Raise the foot of your bed at night with 2-inch blocks.  If you get a cut in the skin over the vein and the vein bleeds, lie down with your leg raised and press on it with a clean cloth until the bleeding stops. Then place a bandage (dressing) on the cut. See your caregiver if it continues to bleed or needs stitches. SEEK MEDICAL CARE IF:   The skin around your ankle starts to break down.  You have pain, redness, tenderness, or hard swelling developing in your leg over a vein.  You are uncomfortable due to leg pain. Document Released: 06/30/2005 Document Revised: 12/13/2011 Document Reviewed: 11/16/2010 [facility] Patient Information 2015 [place], [place]. This information is not intended to replace advice given to you by your health care provider. Make sure you discuss any questions you have with your health care provider. Varicose Vein Surgery Varicose veins are veins that have become enlarged and twisted. Small varicose veins are sometimes called "spider veins." Valves in the veins help move blood from the leg to the heart. If these valves are damaged, blood flows  backwards. The blood backs up into the veins in the leg near the skin surface. The veins expand and get larger because of increased pressure against the inside walls of the veins. People who are on their feet for long periods are at higher risk for this problem. It is also commonly seen during pregnancy or in those who are overweight. Varicose vein surgery is done to remove enlarged veins. This helps reduce pain, aching, and the risk of bleeding and blood clots that can form in these veins. Surgery can also improve the cosmetic appearance of the affected area. There are several surgical methods that can be used. Your caregiver will discuss the method that is best for you based on your specific needs. Treatment usually does not mean a hospital stay or a long, uncomfortable recovery. Less invasive techniques most often allow varicose veins to be dealt with on an outpatient basis. LET YOUR CAREGIVER KNOW ABOUT:   Allergies.  Medications taken including herbs, eye drops, prescription medications, over the counter medications, and creams.  Use of steroids (by mouth or creams).  Use of aspirin or blood-thinning medicines.  History of bleeding or blood problems.  Previous problems with anesthetics or Novocaine.  Possibility of pregnancy, if this applies.  History of blood clots (thrombophlebitis).  Previous surgery.  Other health problems. RISKS AND COMPLICATIONS  Blood clots in deep veins (thrombophlebitis).  Infection.  Ulcer of the skin (breakdown of skin).  Recurrent varicose veins.  If receiving Sclerotherapy (see below), the following are uncommon but possible:  Temporary stinging or painful cramps where the injection was made.  Temporary red, raised patches of skin where the injection was made.  Temporary small skin sores where the injection was made.  Temporary bruises where the injection was made.  Spots around the treated vein that usually disappear.  Brown lines  around the treated vein that usually disappear.  Groups of fine, red blood vessels around the treated vein that usually disappear.  The treated vein can also become inflamed or develop lumps of clotted blood. This is not dangerous. BEFORE THE PROCEDURE  If you are using any "blood thinner" medicines or medicines for arthritis, they may need to be stopped temporarily before the procedure. Make sure you discuss this with your caregiver before surgery. PROCEDURE Several types of surgery are available for safe and effective treatment of varicose veins:   Sclerotherapy. Your caregiver injects small and medium sized varicose veins with a solution that scars and closes those veins. In a few weeks, treated varicose veins should fade. Some veins may need to be injected more than once. Sclerotherapy is effective if done correctly. Sclerotherapy does not require anesthesia and can be done in your doctor's office.  Laser surgeries. Doctors use lasers on smaller varicose veins. Laser surgery works by sending strong bursts of light onto the vein, which makes the vein slowly fade and disappear. No incisions or needles are used. The whole procedure usually takes less than 45 minutes.  Catheter-assisted procedures. This procedure is usually done for larger varicose veins. In one of these treatments, your caregiver inserts a thin tube (catheter) into an enlarged vein and heats the tip of the catheter. As the catheter is pulled out, the heat destroys the vein by causing it to collapse and seal shut.  Vein stripping. This involves removing a long vein through small incisions. This is an outpatient procedure for most, but not all people. Removing the vein does not affect circulation in your leg because veins deeper in the leg take care of the larger volumes of blood.  Ambulatory phlebectomy. Your doctor removes smaller varicose veins through a series of tiny skin punctures. Local anesthesia is used in this outpatient  procedure. Scarring is generally minimal.  Endoscopic vein surgery. You might need this operation only in a severe case involving leg ulcers. Your caregiver uses a thin video camera and inserts it into your leg to close varicose veins. The doctor then removes the veins through small incisions. AFTER THE PROCEDURE   You may be asked to wear a compression stocking for 3 to 5 days. This will insure that the treated vein(s) stays closed.  If you were taking any "blood thinner" medicines or medicines for arthritis, talk to your caregiver about when these can be restarted. HOME CARE INSTRUCTIONS   You may need to elevate the treated leg for part of the day for the first 2-3 days after treatment  You may need to continue using compression stockings to lower the chance of developing recurrent varicose veins.  Restart regular prescription and non-prescription medicines as per your caregiver's instructions. SEEK MEDICAL CARE IF:   An oral temperature above 102 F (38.9 C) develops, not controlled by medication.  You notice increasing pain not adequately controlled with medicine prescribed.  You notice pus or oozing of fluid from any incision site 2 or more days after the surgery. SEEK IMMEDIATE MEDICAL CARE IF:   There is increased pain or swelling in the calf of your leg.  You notice red-streaking starting at an incision site and extending above or below that site.  An oral temperature above 102 F (38.9 C) develops, not controlled by medication.  You have sudden onset of chest pain, shortness of breath, difficulty breathing, or begin coughing up blood.  You develop unexplained abdominal pain.  You have increased bruising or develop sudden swelling in a joint. Document Released: 10/10/2007 Document Revised: 12/13/2011 Document Reviewed: 10/10/2007 [facility] Patient Information 2015 [place], [place]. This information is not intended to replace advice given to you by your health care  provider. Make sure you discuss any questions you have with your health care provider.

## 2014-05-23 NOTE — Progress Notes (Signed)
Pre visit review using our clinic review tool, if applicable. No additional management support is needed unless otherwise documented below in the visit note. 

## 2014-05-23 NOTE — Progress Notes (Signed)
Subjective:    Patient ID: [name], female    DOB: November 25, 1945, 68 y.o.   MRN: [id number]  HPI 68 year old patient who presents with chief complaint of bilateral leg pain, as well as nocturnal cramping.  She does have some varicose veins and is concerned that this may be a factor with her leg pain.  She spends much of her work day on her feet and does note some increasing pain and swelling of the varicosities later in the day.  She generally enjoys excellent health   Past Medical History  Diagnosis Date  . 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 Marital Status: Married    Spouse Name: N/A    Number of Children: N/A  . Years of Education: N/A   Occupational History  . Not on file.   Social History Main Topics  . Smoking status: Never Smoker   . Smokeless tobacco: Never Used  . Alcohol Use: Yes  . Drug Use: No  . Sexual Activity: Not on file   Other Topics Concern  . Not on file   Social History Narrative  . No narrative on file    Past Surgical History  Procedure Laterality Date  . Appendectomy    . Abdominal hysterectomy    . Colectomy      Family History  Problem Relation Age of Onset  . Thyroid disease Mother   . Arthritis Mother   . Cancer Father     lung ca , cad, smoker  . Heart disease Father     Allergies  Allergen Reactions  . Penicillins     Current Outpatient Prescriptions on File Prior to Visit  Medication Sig Dispense Refill  . estradiol (CLIMARA - DOSED IN MG/24 HR) 0.025 mg/24hr patch Place 1 patch (0.025 mg total) onto the skin once a week.  12 patch  3  . ibuprofen (ADVIL) 200 MG tablet Take 400 mg by mouth every 6 (six) hours as needed for pain.       No current facility-administered medications on file prior to visit.    BP 100/60  Pulse 55  Temp(Src) 97.3 F (36.3 C) (Oral)  Resp 18   Ht 5' 4.75" (1.645 m)  Wt 135 lb (61.236 kg)  BMI 22.63 kg/m2  SpO2 99%     Review of Systems  Constitutional: Negative.   HENT: Negative for congestion, dental problem, hearing loss, rhinorrhea, sinus pressure, sore throat and tinnitus.   Eyes: Negative for pain, discharge and visual disturbance.  Respiratory: Negative for cough and shortness of breath.   Cardiovascular: Negative for chest pain, palpitations and leg swelling.  Gastrointestinal: Negative for nausea, vomiting, abdominal pain, diarrhea, constipation, blood in stool and abdominal distention.  Genitourinary: Negative for dysuria, urgency, frequency, hematuria, flank pain, vaginal bleeding, vaginal discharge, difficulty urinating, vaginal pain and pelvic pain.  Musculoskeletal: Negative for arthralgias, gait problem and joint swelling.  Skin: Negative for rash.  Neurological: Negative for dizziness, syncope, speech difficulty, weakness, numbness and headaches.  Hematological: Negative for adenopathy.  Psychiatric/Behavioral: Negative for behavioral problems, dysphoric mood and agitation. The patient is not nervous/anxious.        Objective:   Physical Exam  Constitutional: She appears well-developed and well-nourished. No distress.  Cardiovascular: Normal rate and regular rhythm.   Pulmonary/Chest: Breath sounds normal.  Musculoskeletal: She exhibits no edema.  Superficial varicosities noted,  most marked involving the right medial calf area No peripheral edema          Assessment & Plan:  Nocturnal cramps Bilateral right greater than left leg pain/varicosities.  Patient wishes to be evaluated by a pain specialist.  In the, meantime, she'll try more rest, elevation, and consider compression hose

## 2014-05-30 ENCOUNTER — Other Ambulatory Visit: Payer: Self-pay | Admitting: *Deleted

## 2014-05-30 DIAGNOSIS — M79609 Pain in unspecified limb: Secondary | ICD-10-CM

## 2014-05-30 DIAGNOSIS — I83893 Varicose veins of bilateral lower extremities with other complications: Secondary | ICD-10-CM

## 2014-06-12 ENCOUNTER — Telehealth: Payer: Self-pay | Admitting: Internal Medicine

## 2014-06-12 DIAGNOSIS — M25561 Pain in right knee: Secondary | ICD-10-CM

## 2014-06-12 NOTE — Telephone Encounter (Signed)
Pt was seen approximately a month ago with knee pain, states right knee is worse and she is now limping is requesting a referral to orthopedic specialist.  Pt has [employer].

## 2014-06-13 NOTE — Telephone Encounter (Signed)
Discussed with Dr. Raliegh Ip yesterday, okay to send referral for Ortho.

## 2014-06-13 NOTE — Telephone Encounter (Signed)
Pt called back, told her I sent order for referral to Orthopedics and someone will contact her regarding an appointment. Pt verbalized understanding.

## 2014-06-13 NOTE — Telephone Encounter (Signed)
Left message on voicemail to call office. Order for referral to Orthopedic sent.

## 2014-07-15 ENCOUNTER — Encounter (HOSPITAL_COMMUNITY): Payer: Commercial Managed Care - HMO

## 2014-07-15 ENCOUNTER — Encounter: Payer: Commercial Managed Care - HMO | Admitting: Surgery

## 2014-07-17 ENCOUNTER — Encounter: Payer: Self-pay | Admitting: Vascular Surgery

## 2014-07-18 ENCOUNTER — Encounter: Payer: Commercial Managed Care - HMO | Admitting: Vascular Surgery

## 2014-07-18 ENCOUNTER — Encounter (HOSPITAL_COMMUNITY): Payer: Commercial Managed Care - HMO

## 2014-08-21 ENCOUNTER — Encounter: Payer: Self-pay | Admitting: Vascular Surgery

## 2014-08-22 ENCOUNTER — Encounter: Payer: Commercial Managed Care - HMO | Admitting: Vascular Surgery

## 2014-08-22 ENCOUNTER — Ambulatory Visit (HOSPITAL_COMMUNITY)
Admission: RE | Admit: 2014-08-22 | Discharge: 2014-08-22 | Disposition: A | Discharge status: Home / Self Care | Payer: Commercial Managed Care - HMO | Source: Ambulatory Visit | Attending: Surgery | Admitting: Surgery

## 2014-08-22 DIAGNOSIS — M79609 Pain in unspecified limb: Secondary | ICD-10-CM

## 2014-08-22 DIAGNOSIS — I8393 Asymptomatic varicose veins of bilateral lower extremities: Secondary | ICD-10-CM

## 2014-10-15 ENCOUNTER — Other Ambulatory Visit: Payer: Self-pay

## 2014-10-15 ENCOUNTER — Telehealth: Payer: Self-pay | Admitting: Internal Medicine

## 2014-10-15 ENCOUNTER — Other Ambulatory Visit: Payer: Self-pay | Admitting: *Deleted

## 2014-10-15 ENCOUNTER — Telehealth: Payer: Self-pay | Admitting: *Deleted

## 2014-10-15 DIAGNOSIS — C786 Secondary malignant neoplasm of retroperitoneum and peritoneum: Secondary | ICD-10-CM

## 2014-10-15 DIAGNOSIS — Z1231 Encounter for screening mammogram for malignant neoplasm of breast: Secondary | ICD-10-CM

## 2014-10-15 MED ORDER — ESTRADIOL 0.025 MG/24HR TD PTWK: 0.0250 mg | MEDICATED_PATCH | TRANSDERMAL | Status: DC

## 2014-10-15 NOTE — Telephone Encounter (Signed)
Patient called stating she was a prior Dr. Beryle Beams patient and that he was going to turn her care over to Dr. Marko Plume. She is requesting an appt with Dr. Marko Plume. Dr. Marko Plume notified of this and POF placed for appt. Called patient and let her know that scheduling will be calling her with an appointment.

## 2014-10-15 NOTE — Telephone Encounter (Signed)
Pt request refill of the following:estradiol (CLIMARA - DOSED IN MG/24 HR) 0.025 mg/24hr patch   Phamacy: [facility]

## 2014-10-15 NOTE — Telephone Encounter (Signed)
Spoke to pt, told her Rx was sent to pharmacy and you are overdue for physical. Pt verbalized understanding and transferred to scheduling.

## 2014-10-16 ENCOUNTER — Telehealth: Payer: Self-pay | Admitting: Oncology

## 2014-10-16 NOTE — Telephone Encounter (Signed)
, °

## 2014-10-23 ENCOUNTER — Ambulatory Visit
Admission: RE | Admit: 2014-10-23 | Discharge: 2014-10-23 | Disposition: A | Discharge status: Home / Self Care | Payer: Commercial Managed Care - HMO | Source: Ambulatory Visit

## 2014-10-23 DIAGNOSIS — Z1231 Encounter for screening mammogram for malignant neoplasm of breast: Secondary | ICD-10-CM

## 2014-11-06 ENCOUNTER — Other Ambulatory Visit: Payer: Self-pay | Admitting: Oncology

## 2014-11-06 DIAGNOSIS — C786 Secondary malignant neoplasm of retroperitoneum and peritoneum: Secondary | ICD-10-CM

## 2014-11-07 ENCOUNTER — Encounter: Payer: Self-pay | Admitting: Oncology

## 2014-11-07 ENCOUNTER — Telehealth: Payer: Self-pay | Admitting: Oncology

## 2014-11-07 ENCOUNTER — Other Ambulatory Visit (HOSPITAL_BASED_OUTPATIENT_CLINIC_OR_DEPARTMENT_OTHER): Payer: Commercial Managed Care - HMO

## 2014-11-07 ENCOUNTER — Encounter: Payer: Self-pay | Admitting: Internal Medicine

## 2014-11-07 ENCOUNTER — Ambulatory Visit (HOSPITAL_BASED_OUTPATIENT_CLINIC_OR_DEPARTMENT_OTHER): Payer: Commercial Managed Care - HMO | Admitting: Oncology

## 2014-11-07 VITALS — BP 124/72 | HR 67 | Temp 97.7°F | Resp 18 | Ht 64.75 in | Wt 139.1 lb

## 2014-11-07 DIAGNOSIS — Z8589 Personal history of malignant neoplasm of other organs and systems: Secondary | ICD-10-CM

## 2014-11-07 DIAGNOSIS — Z9049 Acquired absence of other specified parts of digestive tract: Secondary | ICD-10-CM

## 2014-11-07 DIAGNOSIS — C786 Secondary malignant neoplasm of retroperitoneum and peritoneum: Secondary | ICD-10-CM

## 2014-11-07 LAB — COMPREHENSIVE METABOLIC PANEL (CC13)
ALT: 10 U/L (ref 0–55)
AST: 13 U/L (ref 5–34)
Albumin: 3.7 g/dL (ref 3.5–5.0)
Alkaline Phosphatase: 52 U/L (ref 40–150)
Anion Gap: 9 mEq/L (ref 3–11)
BUN: 19.1 mg/dL (ref 7.0–26.0)
CHLORIDE: 107 meq/L (ref 98–109)
CO2: 25 meq/L (ref 22–29)
Calcium: 8.8 mg/dL (ref 8.4–10.4)
Creatinine: 0.9 mg/dL (ref 0.6–1.1)
EGFR: 63 mL/min/{1.73_m2} — ABNORMAL LOW (ref >90)
Glucose: 100 mg/dl (ref 70–140)
Potassium: 3.9 mEq/L (ref 3.5–5.1)
Sodium: 141 mEq/L (ref 136–145)
Total Bilirubin: 0.46 mg/dL (ref 0.20–1.20)
Total Protein: 6.7 g/dL (ref 6.4–8.3)

## 2014-11-07 LAB — CBC WITH DIFFERENTIAL/PLATELET
BASO%: 0.7 % (ref 0.0–2.0)
Basophils Absolute: 0 10*3/uL (ref 0.0–0.1)
EOS%: 2.6 % (ref 0.0–7.0)
Eosinophils Absolute: 0.1 10*3/uL (ref 0.0–0.5)
HCT: 39.4 % (ref 34.8–46.6)
HGB: 12.9 g/dL (ref 11.6–15.9)
LYMPH#: 1.7 10*3/uL (ref 0.9–3.3)
LYMPH%: 30.1 % (ref 14.0–49.7)
MCH: 32 pg (ref 25.1–34.0)
MCHC: 32.7 g/dL (ref 31.5–36.0)
MCV: 97.9 fL (ref 79.5–101.0)
MONO#: 0.6 10*3/uL (ref 0.1–0.9)
MONO%: 9.9 % (ref 0.0–14.0)
NEUT%: 56.7 % (ref 38.4–76.8)
NEUTROS ABS: 3.2 10*3/uL (ref 1.5–6.5)
PLATELETS: 198 10*3/uL (ref 145–400)
RBC: 4.03 10*6/uL (ref 3.70–5.45)
RDW: 12.9 % (ref 11.2–14.5)
WBC: 5.7 10*3/uL (ref 3.9–10.3)

## 2014-11-07 NOTE — Telephone Encounter (Signed)
per pof to sch pt w/D[facility] Brodie-cld & sch appts-auth referral-per pof to sch 1 yr/ll sch not opened-will print & call pt when sch opens

## 2014-11-07 NOTE — Progress Notes (Signed)
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 She has had no known active disease since treatment in 2004 in [place]. She saw Dr Alycia Rossetti after transferring care to [facility], but is no longer followed regularly by gyn oncology.  Patient has felt entirely well since she saw Dr Beryle Beams a year ago. She has no abdominal or pelvic discomfort, no change in regular bowel movements, appetite and energy good.  Flu vaccine done   ONCOLOGIC HISTORY She was evaluated in June of 2004 for left lower quadrant abdominal pain, initially was felt to be an ovarian cyst. She underwent a TAH/BSO with omentectomy at the [facility] in [place], [place] on 03/11/03. Additional pathology was obvious at time of surgical exploration. A small amount of mucinous ascites was found. This was limited to the area of the cul-de-sac. A large 10 cm ovarian mass was removed. A smaller lesion was excised from the right ovary and tube. Both of the ovaries were noted to have mucoid lesions in several areas. Additional lesions were seen on the uterus. The omentum was resected and appeared negative grossly. A radical bilateral pelvic lymphadenectomy was done. A number of mucinous lesions were seen in multiple areas in the right upper abdominal quadrant near the right diaphragm and along the small bowel and the areas in the cul-de-sac and on the bladder. A peritoneotomy was performed. All the mucinous lesions were meticulously identified and resected. The appendix appeared pathologic, grossly dilated and leaking  mucinous fluid from its tip. A general surgeon was called in on the case and a right hemicolectomy was performed. 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 Thirteen pericolic lymph nodes were negative as were lymph nodes taken from the right and left pelvis.  Preop tumor markers showed mild elevation of CA-125, 45 units, normal less than 21, done 02/27/03. Subsequent CEAs done postop were all undetectable, less than 0.5, done 04/04/03 and subsequent levels also normal.  She was followed by clinical exam, periodic CT scans and PET scans since diagnisis, with no gross evidence for recurrent disease. Most recent CT scan report available prior to her moving to [facility] done 04/27/04 was unremarkable. PET scan done one month after her surgery on 04/05/03 showed no focal abnormal uptake. Last imaging in this EMR was CT AP 03-2008 and last PET in this EMR 10-2004.  Review of systems as above, also: No recent infectious illness or fever. No bleeding. No arthritis symptoms. No SOB or other respiratory symptoms. No noted changes in breasts, up to date on mammograms. Bladder ok. No LE swelling. She plays tennis 3x weekly and walks her dogs regularly.  Remainder of 10 point Review of Systems negative.  Past medical/ surgical history Hysterectomy and oophorectomy, right hemicolectomy at surgery 03-2003 No other surgery DEXA once "years ago" reportedly "great" [place] 10-28-14 Colonoscopy shortly prior to  Surgery 03-2003. She recalls difficulty tolerating prep even prior to right hemicolectomy  Social History: originally from [place], in [place] x 30 years, in [place] x 12 years. She works with [employer] in [place], husband retired. Son and 2 grands ages  6 and 4 also in [place].  No tobacco  Family History Mother living age 34, macular degeneration Father died lung ca, heavy smoker 2 siblings healthy Son healthy  Objective:  Vital signs in  last 24 hours:  BP 124/72 mmHg  Pulse 67  Temp(Src) 97.7 F (36.5 C) (Oral)  Resp 18  Ht 5' 4.75" (1.645 m)  Wt 139 lb 1.6 oz (63.095 kg)  BMI 23.32 kg/m2 Weight up 2 lbs from a year ago Alert, oriented and appropriate. Ambulatory without difficulty. Looks comfortable, very pleasant and excellent historian  HEENT:PERRL, sclerae not icteric. Oral mucosa moist without lesions, posterior pharynx clear.  Neck supple. No JVD.  Lymphatics:no cervical,supraclavicular, axillary or inguinal adenopathy Resp: clear to auscultation bilaterally and normal percussion bilaterally Cardio: regular rate and rhythm. No gallop. GI: soft, nontender, not distended, no mass or organomegaly. Normally active bowel sounds. Surgical incision not remarkable. Musculoskeletal/ Extremities: without pitting edema, cords, tenderness Neuro: speech fluent, no focal deficits. Psych appropriate mood and affect Skin without rash, ecchymosis, petechiae Breasts: without dominant mass, skin or nipple findings. Axillae benign.   Lab Results:  Results for orders placed or performed in visit on 11/07/14  CBC with Differential  Result Value Ref Range   WBC 5.7 3.9 - 10.3 10e3/uL   NEUT# 3.2 1.5 - 6.5 10e3/uL   HGB 12.9 11.6 - 15.9 g/dL   HCT 39.4 34.8 - 46.6 %   Platelets 198 145 - 400 10e3/uL   MCV 97.9 79.5 - 101.0 fL   MCH 32.0 25.1 - 34.0 pg   MCHC 32.7 31.5 - 36.0 g/dL   RBC 4.03 3.70 - 5.45 10e6/uL   RDW 12.9 11.2 - 14.5 %   lymph# 1.7 0.9 - 3.3 10e3/uL   MONO# 0.6 0.1 - 0.9 10e3/uL   Eosinophils Absolute 0.1 0.0 - 0.5 10e3/uL   Basophils Absolute 0.0 0.0 - 0.1 10e3/uL   NEUT% 56.7 38.4 - 76.8 %   LYMPH% 30.1 14.0 - 49.7 %   MONO% 9.9 0.0 - 14.0 %   EOS% 2.6 0.0 - 7.0 %   BASO% 0.7 0.0 - 2.0 %  Comprehensive metabolic panel (Cmet) - [facility]  Result Value Ref Range   Sodium 141 136 - 145 mEq/L   Potassium 3.9 3.5 - 5.1 mEq/L   Chloride 107 98 - 109 mEq/L   CO2 25 22 - 29 mEq/L   Glucose 100 70 - 140  mg/dl   BUN 19.1 7.0 - 26.0 mg/dL   Creatinine 0.9 0.6 - 1.1 mg/dL   Total Bilirubin 0.46 0.20 - 1.20 mg/dL   Alkaline Phosphatase 52 40 - 150 U/L   AST 13 5 - 34 U/L   ALT 10 0 - 55 U/L   Total Protein 6.7 6.4 - 8.3 g/dL   Albumin 3.7 3.5 - 5.0 g/dL   Calcium 8.8 8.4 - 10.4 mg/dL   Anion Gap 9 3 - 11 mEq/L   EGFR 63 (L) >90 ml/min/1.73 m2    CEA available after visit stable at <0.5 CA 125 available after visit stable at 5.0   Studies/Results:  DIGITAL SCREENING BILATERAL MAMMOGRAM WITH 3D TOMO WITH CAD  [facility]  COMPARISON: Previous exam(s).  ACR Breast Density Category b: There are scattered areas of fibroglandular density.  FINDINGS: There are no findings suspicious for malignancy. Images were processed with CAD.  IMPRESSION: No mammographic evidence of malignancy. A result letter of this screening mammogram will be mailed directly to the patient.  RECOMMENDATION: Screening  mammogram in one year. (Code:SM-B-01Y)  BI-RADS CATEGORY 1: Negative  Medications: I have reviewed the patient's current medications.  DISCUSSION: Patient appears to be doing very well, now out almost 12 years from diagnosis and surgical treatment of pseudomyxoma peritonei. She prefers to continue yearly follow up at this office for now. She is agreeable to referral to Dr Delfin Edis, as she is due colonoscopy.  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 Clinically doing well, agree with no regular scans now. WIll see her back in a year, or sooner if needed. 2.overdue colonoscopy: referral to Dr Olevia Perches. Note she has concerns about prep, which I have encouraged her to discuss with Dr Olevia Perches 3.no recent DEXA, tho very active and reportedly good bone density some years ago 4.flu vaccine done  All questions answered and patient is in agreement with plans and  recommendations above. I am glad to see her again at any time prior to scheduled return visit if needed. Time spent 25 min including >50% counseling and coordination of care.  LIVESAY,LENNIS P, MD   11/07/2014, 3:28 PM

## 2014-11-08 ENCOUNTER — Telehealth: Payer: Self-pay | Admitting: Medical Oncology

## 2014-11-08 LAB — CEA: CEA: <0.5 ng/mL (ref 0.0–5.0)

## 2014-11-08 LAB — CA 125(PREVIOUS METHOD): CA 125: 4.1 U/mL (ref 0.0–30.2)

## 2014-11-08 LAB — CA 125: CA 125: 5 U/mL (ref <35)

## 2014-11-08 NOTE — Telephone Encounter (Signed)
-----   Message from Janace Hoard, RN sent at 11/08/2014  4:25 PM EST -----   ----- Message -----    From: Gordy Levan, MD    Sent: 11/08/2014   9:32 AM      To: Baruch Merl, RN, Janace Hoard, RN, #  Labs seen and need follow up: please let her know both CEA and CA 125 in good low range, with ca 125 of 5 and CEA of <0.5

## 2014-11-08 NOTE — Telephone Encounter (Signed)
Pt.notified

## 2014-11-11 ENCOUNTER — Telehealth: Payer: Self-pay | Admitting: *Deleted

## 2014-11-11 NOTE — Telephone Encounter (Signed)
-----   Message from Lafayette Dragon, MD sent at 11/10/2014  1:37 PM EST ----- Regarding: Lacie Draft, this pt is being referred for screening colonoscopy. She will need an OV first to discuss prep. I cannot find any past colon reports. Please look to see if paper chart  Has anything. thanx DB ----- Message -----    From: Gordy Levan, MD    Sent: 11/10/2014  10:28 AM      To: Lafayette Dragon, MD  Absolutely delightful lady, will see you in April.  Thanks   Lennis

## 2014-11-11 NOTE — Telephone Encounter (Signed)
No old records found in our system for this patient.

## 2014-11-11 NOTE — Telephone Encounter (Signed)
Patient is already scheduled for OV in April.

## 2014-11-21 ENCOUNTER — Encounter (HOSPITAL_COMMUNITY): Payer: Commercial Managed Care - HMO

## 2014-11-21 ENCOUNTER — Encounter: Payer: Commercial Managed Care - HMO | Admitting: Vascular Surgery

## 2014-12-13 ENCOUNTER — Other Ambulatory Visit (INDEPENDENT_AMBULATORY_CARE_PROVIDER_SITE_OTHER): Payer: Commercial Managed Care - HMO

## 2014-12-13 ENCOUNTER — Encounter: Payer: Self-pay | Admitting: Surgery

## 2014-12-13 DIAGNOSIS — Z0000 Encounter for general adult medical examination without abnormal findings: Secondary | ICD-10-CM

## 2014-12-13 LAB — POCT URINALYSIS DIPSTICK
Bilirubin, UA: NEGATIVE
Blood, UA: NEGATIVE
Glucose, UA: NEGATIVE
Ketones, UA: NEGATIVE
LEUKOCYTES UA: NEGATIVE
Nitrite, UA: NEGATIVE
PH UA: 5.5
Protein, UA: NEGATIVE
Spec Grav, UA: 1.02
Urobilinogen, UA: 0.2

## 2014-12-13 LAB — LIPID PANEL
Cholesterol: 197 mg/dL (ref 0–200)
HDL: 71.5 mg/dL (ref >39.00)
LDL Cholesterol: 114 mg/dL — ABNORMAL HIGH (ref 0–99)
NonHDL: 125.5
Total CHOL/HDL Ratio: 3
Triglycerides: 56 mg/dL (ref 0.0–149.0)
VLDL: 11.2 mg/dL (ref 0.0–40.0)

## 2014-12-13 LAB — CBC WITH DIFFERENTIAL/PLATELET
BASOS ABS: 0 10*3/uL (ref 0.0–0.1)
Basophils Relative: 0.7 % (ref 0.0–3.0)
EOS PCT: 3.5 % (ref 0.0–5.0)
Eosinophils Absolute: 0.2 10*3/uL (ref 0.0–0.7)
HCT: 39.8 % (ref 36.0–46.0)
HEMOGLOBIN: 13.6 g/dL (ref 12.0–15.0)
LYMPHS PCT: 32.8 % (ref 12.0–46.0)
Lymphs Abs: 1.6 10*3/uL (ref 0.7–4.0)
MCHC: 34.2 g/dL (ref 30.0–36.0)
MCV: 94.5 fl (ref 78.0–100.0)
MONOS PCT: 8.9 % (ref 3.0–12.0)
Monocytes Absolute: 0.4 10*3/uL (ref 0.1–1.0)
NEUTROS PCT: 54.1 % (ref 43.0–77.0)
Neutro Abs: 2.7 10*3/uL (ref 1.4–7.7)
PLATELETS: 174 10*3/uL (ref 150.0–400.0)
RBC: 4.21 Mil/uL (ref 3.87–5.11)
RDW: 13.4 % (ref 11.5–15.5)
WBC: 4.9 10*3/uL (ref 4.0–10.5)

## 2014-12-13 LAB — BASIC METABOLIC PANEL
BUN: 18 mg/dL (ref 6–23)
CALCIUM: 9.3 mg/dL (ref 8.4–10.5)
CO2: 28 mEq/L (ref 19–32)
CREATININE: 0.92 mg/dL (ref 0.40–1.20)
Chloride: 107 mEq/L (ref 96–112)
GFR: 64.68 mL/min (ref >60.00)
GLUCOSE: 88 mg/dL (ref 70–99)
POTASSIUM: 4.7 meq/L (ref 3.5–5.1)
SODIUM: 139 meq/L (ref 135–145)

## 2014-12-13 LAB — HEPATIC FUNCTION PANEL
ALT: 11 U/L (ref 0–35)
AST: 15 U/L (ref 0–37)
Albumin: 4.1 g/dL (ref 3.5–5.2)
Alkaline Phosphatase: 38 U/L — ABNORMAL LOW (ref 39–117)
BILIRUBIN TOTAL: 0.8 mg/dL (ref 0.2–1.2)
Bilirubin, Direct: 0.2 mg/dL (ref 0.0–0.3)
Total Protein: 6.9 g/dL (ref 6.0–8.3)

## 2014-12-13 LAB — TSH: TSH: 2.86 u[IU]/mL (ref 0.35–4.50)

## 2014-12-16 ENCOUNTER — Ambulatory Visit (INDEPENDENT_AMBULATORY_CARE_PROVIDER_SITE_OTHER): Payer: Commercial Managed Care - HMO | Admitting: Surgery

## 2014-12-16 ENCOUNTER — Other Ambulatory Visit: Payer: Self-pay | Admitting: Surgery

## 2014-12-16 ENCOUNTER — Ambulatory Visit (HOSPITAL_COMMUNITY)
Admission: RE | Admit: 2014-12-16 | Discharge: 2014-12-16 | Disposition: A | Discharge status: Home / Self Care | Payer: Commercial Managed Care - HMO | Source: Ambulatory Visit | Attending: Surgery | Admitting: Surgery

## 2014-12-16 ENCOUNTER — Encounter: Payer: Self-pay | Admitting: Surgery

## 2014-12-16 VITALS — BP 111/76 | HR 58 | Ht 64.75 in | Wt 137.8 lb

## 2014-12-16 DIAGNOSIS — I8393 Asymptomatic varicose veins of bilateral lower extremities: Secondary | ICD-10-CM

## 2014-12-16 DIAGNOSIS — M79604 Pain in right leg: Secondary | ICD-10-CM

## 2014-12-16 DIAGNOSIS — M79605 Pain in left leg: Secondary | ICD-10-CM | POA: Diagnosis not present

## 2014-12-16 DIAGNOSIS — I872 Venous insufficiency (chronic) (peripheral): Secondary | ICD-10-CM

## 2014-12-16 NOTE — Progress Notes (Signed)
Patient name: [name] MRN: [id number] DOB: 02-05-1946 Sex: female   Referred by: Dr. Inda Merlin  Reason for referral:  Chief Complaint  Patient presents with  . Varicose Veins    BLE vv's w/ pain R>L - c/o cramps in legs at night and heaviness and tiredness during the day    HISTORY OF PRESENT ILLNESS: This is a 69 year old female who comes in today for evaluation of bilateral leg pain and varicosities.  The patient states that she has had symptoms for a long time but have gotten worse.  She describes aching in both of her legs which is worse at the end of the day and is alleviated by rest.  She also complains of cramping particularly at night.  She denies a history of DVT.  There is a family history of varicosities in her mother who had vein stripping.  The patient also reports sclerotherapy approximately 20 years ago which did benefit her.  Past Medical History  Diagnosis Date  . 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 Pseudomyxoma peritonei 10/22/2012    Dx 02/2003 Rx radical surgery; primary: appendix    Past Surgical History  Procedure Laterality Date  . Appendectomy    . Abdominal hysterectomy    . Colectomy      History   Social History  . Marital Status: Married    Spouse Name: N/A  . Number of Children: N/A  . Years of Education: N/A   Occupational History  . Not on file.   Social History Main Topics  . Smoking status: Never Smoker   . Smokeless tobacco: Never Used  . Alcohol Use: 0.0 oz/week    0 Standard drinks or equivalent per week  . Drug Use: No  . Sexual Activity: Not on file   Other Topics Concern  . Not on file   Social History Narrative    Family History  Problem Relation Age of Onset  . Thyroid disease Mother   . Arthritis Mother   . Cancer Father     lung ca , cad, smoker  . Heart disease Father     Allergies as of 12/16/2014 - Review Complete 12/16/2014  Allergen Reaction Noted  . Penicillins   06/09/2010    Current Outpatient Prescriptions on File Prior to Visit  Medication Sig Dispense Refill  . estradiol (CLIMARA - DOSED IN MG/24 HR) 0.025 mg/24hr patch Place 1 patch (0.025 mg total) onto the skin once a week. 12 patch 1  . ibuprofen (ADVIL) 200 MG tablet Take 400 mg by mouth every 6 (six) hours as needed for pain.     No current facility-administered medications on file prior to visit.     REVIEW OF SYSTEMS: Cardiovascular: No chest pain, chest pressure, palpitations, orthopnea, or dyspnea on exertion. No claudication or rest pain,  No history of DVT or phlebitis. Pulmonary: No productive cough, asthma or wheezing. Neurologic: No weakness, paresthesias, aphasia, or amaurosis. No dizziness. Hematologic: No bleeding problems or clotting disorders. Musculoskeletal: No joint pain or joint swelling. Gastrointestinal: No blood in stool or hematemesis Genitourinary: No dysuria or hematuria. Psychiatric:: No history of major depression. Integumentary: No rashes or ulcers. Constitutional: No fever or chills.  PHYSICAL EXAMINATION: General: The patient appears their stated age.  Vital signs are BP 111/76 mmHg  Pulse 58  Ht 5' 4.75" (1.645 m)  Wt 137 lb 12.8 oz (62.506 kg)  BMI 23.10 kg/m2  SpO2 100% HEENT:  No gross abnormalities Pulmonary: Respirations are non-labored Musculoskeletal:  There are no major deformities.   Neurologic: No focal weakness or paresthesias are detected, Skin: There are no ulcer or rashes noted. Psychiatric: The patient has normal affect. Cardiovascular:  Trace edema bilaterally.  Palpable pedal pulses.  Prominent varicosities on the right medial calf and the left anterior lower leg  Diagnostic Studies: Venous reflux evaluation was performed on the right there is deep vein reflux in the common femoral and superficial femoral vein.  There is also superficial reflux in the great saphenous vein with maximum diameter of 0.68 cm at the saphenofemoral  junction.  On the left there is deep vein reflux in the superficial femoral vein and popliteal vein with significant superficial venous reflux in the great saphenous vein with maximum diameter of 0.65 of the saphenofemoral junction     Assessment:   bilateral venous insufficiency Plan: The patient has symptomatic venous insufficiency, as documented by her symptoms as well as her ultrasound studies.  I am going to place her into compression stockings to make sure that this helps alleviate her symptoms.  I discussed that it may not help her cramping in her toes which occurs at night.  She will wear 20-30 millimeter thigh-high compression stockings and follow up with [place] in 3 months to discuss laser ablation and stab phlebectomy of her varicosities.     Eldridge Abrahams, M.D. [facility] Office: [phone] Pager:  [phone]

## 2014-12-20 ENCOUNTER — Encounter: Payer: Commercial Managed Care - HMO | Admitting: Internal Medicine

## 2015-01-14 ENCOUNTER — Ambulatory Visit: Payer: Commercial Managed Care - HMO | Admitting: Internal Medicine

## 2015-01-30 ENCOUNTER — Other Ambulatory Visit: Payer: Self-pay | Admitting: Dermatology

## 2015-03-14 ENCOUNTER — Encounter: Payer: Self-pay | Admitting: Vascular Surgery

## 2015-03-18 ENCOUNTER — Ambulatory Visit (INDEPENDENT_AMBULATORY_CARE_PROVIDER_SITE_OTHER): Payer: Commercial Managed Care - HMO | Admitting: Vascular Surgery

## 2015-03-18 ENCOUNTER — Ambulatory Visit: Payer: Commercial Managed Care - HMO | Admitting: Vascular Surgery

## 2015-03-18 ENCOUNTER — Encounter: Payer: Self-pay | Admitting: Vascular Surgery

## 2015-03-18 VITALS — BP 109/74 | HR 60 | Resp 14 | Ht 66.0 in | Wt 134.0 lb

## 2015-03-18 DIAGNOSIS — I83893 Varicose veins of bilateral lower extremities with other complications: Secondary | ICD-10-CM

## 2015-03-18 DIAGNOSIS — I83899 Varicose veins of unspecified lower extremities with other complications: Secondary | ICD-10-CM | POA: Insufficient documentation

## 2015-03-18 NOTE — Progress Notes (Signed)
Subjective:     Patient ID: [name], female   DOB: [birth year], 69 y.o.   MRN: [id number]  HPI this 69 year old female returns continued follow-up regarding her painful varicosities in both lower extremities. She has aching throbbing and burning discomfort in both legs left worse than right and was evaluated by Dr. Trula Slade 3 months ago. She has tried long-leg elastic compression stockings 20-30 millimeter gradient as well as elevation and ibuprofen but continues to have worsening symptoms which are affecting her daily living. She has no history of DVT or thrombophlebitis.  Past Medical History  Diagnosis Date  . 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 Varicose veins     History  Substance Use Topics  . Smoking status: Never Smoker   . Smokeless tobacco: Never Used  . Alcohol Use: 0.0 oz/week    0 Standard drinks or equivalent per week    Family History  Problem Relation Age of Onset  . Thyroid disease Mother   . Arthritis Mother   . Cancer Father     lung ca , cad, smoker  . Heart disease Father     Allergies  Allergen Reactions  . Penicillins      Current outpatient prescriptions:  .  estradiol (CLIMARA - DOSED IN MG/24 HR) 0.025 mg/24hr patch, Place 1 patch (0.025 mg total) onto the skin once a week., Disp: 12 patch, Rfl: 1 .  ibuprofen (ADVIL) 200 MG tablet, Take 400 mg by mouth every 6 (six) hours as needed for pain., Disp: , Rfl:   Filed Vitals:   03/18/15 0837  BP: 109/74  Pulse: 60  Resp: 14  Height: 5\' 6"  (1.676 m)  Weight: 134 lb (60.782 kg)    Body mass index is 21.64 kg/(m^2).           Review of Systems denies chest pain, dyspnea on exertion, PND, orthopnea, hemoptysis     Objective:   Physical Exam BP 109/74 mmHg  Pulse 60  Resp 14  Ht 5\' 6"  (1.676 m)  Wt 134 lb (60.782 kg)  BMI 21.64 kg/m2  Gen. well-developed  well-nourished female no apparent distress alert and oriented 3 Lungs no rhonchi or wheezing Cardiovascular regular rhythm no murmurs Right leg with bulging varicosities in the medial calf and lateral distal thigh with 1+ edema Left leg with bulging varicosities in the medial calf with 1+ edema distally. No hyperpigmentation or ulceration noted bilaterally. 2+ but posterior tibial pulses palpable bilaterally.  Patient has documented gross reflux in both great saphenous systems bilaterally from formal duplex exam in March of this year. Today I performed a bedside sono site exam and concur with that finding that she does have great saphenous vein supplying these painful varicosities which have reflux in both legs.     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 We will proceed with precertification to perform this in the near future to relieve her symptoms

## 2015-03-25 ENCOUNTER — Other Ambulatory Visit: Payer: Self-pay | Admitting: *Deleted

## 2015-03-25 DIAGNOSIS — I83891 Varicose veins of right lower extremities with other complications: Secondary | ICD-10-CM

## 2015-03-25 DIAGNOSIS — I83892 Varicose veins of left lower extremities with other complications: Secondary | ICD-10-CM

## 2015-03-28 ENCOUNTER — Encounter: Payer: Self-pay | Admitting: Internal Medicine

## 2015-03-28 ENCOUNTER — Ambulatory Visit (INDEPENDENT_AMBULATORY_CARE_PROVIDER_SITE_OTHER): Payer: Commercial Managed Care - HMO | Admitting: Internal Medicine

## 2015-03-28 VITALS — BP 90/62 | HR 62 | Temp 97.9°F | Resp 18 | Ht 64.5 in | Wt 136.0 lb

## 2015-03-28 DIAGNOSIS — Z780 Asymptomatic menopausal state: Secondary | ICD-10-CM

## 2015-03-28 DIAGNOSIS — Z79890 Hormone replacement therapy (postmenopausal): Secondary | ICD-10-CM

## 2015-03-28 DIAGNOSIS — Z23 Encounter for immunization: Secondary | ICD-10-CM

## 2015-03-28 DIAGNOSIS — Z1231 Encounter for screening mammogram for malignant neoplasm of breast: Secondary | ICD-10-CM

## 2015-03-28 DIAGNOSIS — C786 Secondary malignant neoplasm of retroperitoneum and peritoneum: Secondary | ICD-10-CM

## 2015-03-28 DIAGNOSIS — I83893 Varicose veins of bilateral lower extremities with other complications: Secondary | ICD-10-CM

## 2015-03-28 DIAGNOSIS — Z0000 Encounter for general adult medical examination without abnormal findings: Secondary | ICD-10-CM | POA: Diagnosis not present

## 2015-03-28 MED ORDER — ESTRADIOL 0.025 MG/24HR TD PTWK: 0.0250 mg | MEDICATED_PATCH | TRANSDERMAL | Status: DC

## 2015-03-28 NOTE — Patient Instructions (Signed)
It is important that you exercise regularly, at least 20 minutes 3 to 4 times per week.  If you develop chest pain or shortness of breath seek  medical attention.  Return in one year for follow-up  Health Maintenance Adopting a healthy lifestyle and getting preventive care can go a long way to promote health and wellness. Talk with your health care provider about what schedule of regular examinations is right for you. This is a good chance for you to check in with your provider about disease prevention and staying healthy. In between checkups, there are plenty of things you can do on your own. Experts have done a lot of research about which lifestyle changes and preventive measures are most likely to keep you healthy. Ask your health care provider for more information. WEIGHT AND DIET  Eat a healthy diet  Be sure to include plenty of vegetables, fruits, low-fat dairy products, and lean protein.  Do not eat a lot of foods high in solid fats, added sugars, or salt.  Get regular exercise. This is one of the most important things you can do for your health.  Most adults should exercise for at least 150 minutes each week. The exercise should increase your heart rate and make you sweat (moderate-intensity exercise).  Most adults should also do strengthening exercises at least twice a week. This is in addition to the moderate-intensity exercise.  Maintain a healthy weight  Body mass index (BMI) is a measurement that can be used to identify possible weight problems. It estimates body fat based on height and weight. Your health care provider can help determine your BMI and help you achieve or maintain a healthy weight.  For females 73 years of age and older:   A BMI below 18.5 is considered underweight.  A BMI of 18.5 to 24.9 is normal.  A BMI of 25 to 29.9 is considered overweight.  A BMI of 30 and above is considered obese.  Watch levels of cholesterol and blood lipids  You should  start having your blood tested for lipids and cholesterol at 67 years of age, then have this test every 5 years.  You may need to have your cholesterol levels checked more often if:  Your lipid or cholesterol levels are high.  You are older than 67 years of age.  You are at high risk for heart disease.  CANCER SCREENING   Lung Cancer  Lung cancer screening is recommended for adults 54-56 years old who are at high risk for lung cancer because of a history of smoking.  A yearly low-dose CT scan of the lungs is recommended for people who:  Currently smoke.  Have quit within the past 15 years.  Have at least a 30-pack-year history of smoking. A pack year is smoking an average of one pack of cigarettes a day for 1 year.  Yearly screening should continue until it has been 15 years since you quit.  Yearly screening should stop if you develop a health problem that would prevent you from having lung cancer treatment.  Breast Cancer  Practice breast self-awareness. This means understanding how your breasts normally appear and feel.  It also means doing regular breast self-exams. Let your health care provider know about any changes, no matter how small.  If you are in your 20s or 30s, you should have a clinical breast exam (CBE) by a health care provider every 1-3 years as part of a regular health exam.  If you are 40  or older, have a CBE every year. Also consider having a breast X-ray (mammogram) every year.  If you have a family history of breast cancer, talk to your health care provider about genetic screening.  If you are at high risk for breast cancer, talk to your health care provider about having an MRI and a mammogram every year.  Breast cancer gene (BRCA) assessment is recommended for women who have family members with BRCA-related cancers. BRCA-related cancers include:  Breast.  Ovarian.  Tubal.  Peritoneal cancers.  Results of the assessment will determine the  need for genetic counseling and BRCA1 and BRCA2 testing. Cervical Cancer Routine pelvic examinations to screen for cervical cancer are no longer recommended for nonpregnant women who are considered low risk for cancer of the pelvic organs (ovaries, uterus, and vagina) and who do not have symptoms. A pelvic examination may be necessary if you have symptoms including those associated with pelvic infections. Ask your health care provider if a screening pelvic exam is right for you.   The Pap test is the screening test for cervical cancer for women who are considered at risk.  If you had a hysterectomy for a problem that was not cancer or a condition that could lead to cancer, then you no longer need Pap tests.  If you are older than 65 years, and you have had normal Pap tests for the past 10 years, you no longer need to have Pap tests.  If you have had past treatment for cervical cancer or a condition that could lead to cancer, you need Pap tests and screening for cancer for at least 20 years after your treatment.  If you no longer get a Pap test, assess your risk factors if they change (such as having a new sexual partner). This can affect whether you should start being screened again.  Some women have medical problems that increase their chance of getting cervical cancer. If this is the case for you, your health care provider may recommend more frequent screening and Pap tests.  The human papillomavirus (HPV) test is another test that may be used for cervical cancer screening. The HPV test looks for the virus that can cause cell changes in the cervix. The cells collected during the Pap test can be tested for HPV.  The HPV test can be used to screen women 66 years of age and older. Getting tested for HPV can extend the interval between normal Pap tests from three to five years.  An HPV test also should be used to screen women of any age who have unclear Pap test results.  After 67 years of age,  women should have HPV testing as often as Pap tests.  Colorectal Cancer  This type of cancer can be detected and often prevented.  Routine colorectal cancer screening usually begins at 67 years of age and continues through 67 years of age.  Your health care provider may recommend screening at an earlier age if you have risk factors for colon cancer.  Your health care provider may also recommend using home test kits to check for hidden blood in the stool.  A small camera at the end of a tube can be used to examine your colon directly (sigmoidoscopy or colonoscopy). This is done to check for the earliest forms of colorectal cancer.  Routine screening usually begins at age 16.  Direct examination of the colon should be repeated every 5-10 years through 67 years of age. However, you may need to  be screened more often if early forms of precancerous polyps or small growths are found. Skin Cancer  Check your skin from head to toe regularly.  Tell your health care provider about any new moles or changes in moles, especially if there is a change in a mole's shape or color.  Also tell your health care provider if you have a mole that is larger than the size of a pencil eraser.  Always use sunscreen. Apply sunscreen liberally and repeatedly throughout the day.  Protect yourself by wearing long sleeves, pants, a wide-brimmed hat, and sunglasses whenever you are outside. HEART DISEASE, DIABETES, AND HIGH BLOOD PRESSURE   Have your blood pressure checked at least every 1-2 years. High blood pressure causes heart disease and increases the risk of stroke.  If you are between 31 years and 2 years old, ask your health care provider if you should take aspirin to prevent strokes.  Have regular diabetes screenings. This involves taking a blood sample to check your fasting blood sugar level.  If you are at a normal weight and have a low risk for diabetes, have this test once every three years after 67  years of age.  If you are overweight and have a high risk for diabetes, consider being tested at a younger age or more often. PREVENTING INFECTION  Hepatitis B  If you have a higher risk for hepatitis B, you should be screened for this virus. You are considered at high risk for hepatitis B if:  You were born in a country where hepatitis B is common. Ask your health care provider which countries are considered high risk.  Your parents were born in a high-risk country, and you have not been immunized against hepatitis B (hepatitis B vaccine).  You have HIV or AIDS.  You use needles to inject street drugs.  You live with someone who has hepatitis B.  You have had sex with someone who has hepatitis B.  You get hemodialysis treatment.  You take certain medicines for conditions, including cancer, organ transplantation, and autoimmune conditions. Hepatitis C  Blood testing is recommended for:  Everyone born from [birth year] through 1963.  Anyone with known risk factors for hepatitis C. Sexually transmitted infections (STIs)  You should be screened for sexually transmitted infections (STIs) including gonorrhea and chlamydia if:  You are sexually active and are younger than 67 years of age.  You are older than 67 years of age and your health care provider tells you that you are at risk for this type of infection.  Your sexual activity has changed since you were last screened and you are at an increased risk for chlamydia or gonorrhea. Ask your health care provider if you are at risk.  If you do not have HIV, but are at risk, it may be recommended that you take a prescription medicine daily to prevent HIV infection. This is called pre-exposure prophylaxis (PrEP). You are considered at risk if:  You are sexually active and do not regularly use condoms or know the HIV status of your partner(s).  You take drugs by injection.  You are sexually active with a partner who has HIV. Talk with  your health care provider about whether you are at high risk of being infected with HIV. If you choose to begin PrEP, you should first be tested for HIV. You should then be tested every 3 months for as long as you are taking PrEP.  PREGNANCY   If you are premenopausal and  you may become pregnant, ask your health care provider about preconception counseling.  If you may become pregnant, take 400 to 800 micrograms (mcg) of folic acid every day.  If you want to prevent pregnancy, talk to your health care provider about birth control (contraception). OSTEOPOROSIS AND MENOPAUSE   Osteoporosis is a disease in which the bones lose minerals and strength with aging. This can result in serious bone fractures. Your risk for osteoporosis can be identified using a bone density scan.  If you are 65 years of age or older, or if you are at risk for osteoporosis and fractures, ask your health care provider if you should be screened.  Ask your health care provider whether you should take a calcium or vitamin D supplement to lower your risk for osteoporosis.  Menopause may have certain physical symptoms and risks.  Hormone replacement therapy may reduce some of these symptoms and risks. Talk to your health care provider about whether hormone replacement therapy is right for you.  HOME CARE INSTRUCTIONS   Schedule regular health, dental, and eye exams.  Stay current with your immunizations.   Do not use any tobacco products including cigarettes, chewing tobacco, or electronic cigarettes.  If you are pregnant, do not drink alcohol.  If you are breastfeeding, limit how much and how often you drink alcohol.  Limit alcohol intake to no more than 1 drink per day for nonpregnant women. One drink equals 12 ounces of beer, 5 ounces of wine, or 1 ounces of hard liquor.  Do not use street drugs.  Do not share needles.  Ask your health care provider for help if you need support or information about quitting  drugs.  Tell your health care provider if you often feel depressed.  Tell your health care provider if you have ever been abused or do not feel safe at home. Document Released: 04/05/2011 Document Revised: 02/04/2014 Document Reviewed: 08/22/2013 [employer] Patient Information 2015 [employer]. This information is not intended to replace advice given to you by your health care provider. Make sure you discuss any questions you have with your health care provider.  

## 2015-03-28 NOTE — Addendum Note (Signed)
Addended by: Marian Sorrow on: 03/28/2015 09:53 AM   Modules accepted: Orders

## 2015-03-28 NOTE — Progress Notes (Signed)
Subjective:    Patient ID: [name], female    DOB: Apr 05, 1946, 69 y.o.   MRN: [id number]  HPI 69 -year-old patient who is seen today for a wellness exam.  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 She is doing quite well. She remains on hormone replacement therapy and has failed a number of attempts with discontinuation. No concerns or complaints today she did have screening colonoscopy in 2008 and does have regular mammograms.  Past Medical History  Diagnosis Date  . 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 Varicose veins     History   Social History  . Marital Status: Married    Spouse Name: N/A  . Number of Children: N/A  . Years of Education: N/A   Occupational History  . Not on file.   Social History Main Topics  . Smoking status: Never Smoker   . Smokeless tobacco: Never Used  . Alcohol Use: 0.0 oz/week    0 Standard drinks or equivalent per week  . Drug Use: No  . Sexual Activity: Not on file   Other Topics Concern  . Not on file   Social History Narrative    Past Surgical History  Procedure Laterality Date  . Appendectomy    . Abdominal hysterectomy    . Colectomy      Family History  Problem Relation Age of Onset  . Thyroid disease Mother   . Arthritis Mother   . Cancer Father     lung ca , cad, smoker  . Heart disease Father     Allergies  Allergen Reactions  . Penicillins     Current Outpatient Prescriptions on File Prior to Visit  Medication Sig Dispense Refill  . ibuprofen (ADVIL) 200 MG tablet Take 400 mg by mouth every 6 (six) hours as needed for pain.     No current facility-administered medications on file prior to visit.    BP 90/62 mmHg  Pulse 62  Temp(Src) 97.9 F (36.6 C) (Oral)  Resp 18  Ht 5' 4.5" (1.638 m)  Wt 136 lb (61.689 kg)  BMI 22.99  kg/m2  SpO2 98%     1. Risk factors, based on past  M,S,F history-  cardiovascular risk factors-none.    2.  Physical activities:   Remains quite active without exercise limitations.  Plays tennis at least 3 times per week  3.  Depression/mood: No history depression or mood disorder  4.  Hearing: No hearing deficits  5.  ADL's: Independent in all aspects of daily living  6.  Fall risk: Low  7.  Home safety: No problems identified  8.  Height weight, and visual acuity; height and weight stable no change in visual acuity. Uses contact lenses  9.  Counseling: Heart healthy diet regular exercise encouraged followup mammogram encouraged. This has been scheduled  10. Lab orders based on risk factors: Laboratory studies including lipid profile reviewed  11. Referral : Not appropriate at this time  12. Care plan: Heart healthy diet regular exercise encouraged  13. Cognitive assessment: Alert and oriented normal affect. No cognitive dysfunction  14.  Preventive services will include annual examinations with screening lab.  She'll consider annual name of grams.  She is due for follow-up colonoscopy in 2 years.  A follow-up bone density study will performed in 7 months.  At the time  of her next mammogram  15.  Provider list includes primary care medicine and oncology.  Is also scheduled to see vascular surgery for treatment of varicose veins.  Is seen by dermatology periodically       Review of Systems  Constitutional: Negative for fever, appetite change, fatigue and unexpected weight change.  HENT: Negative for congestion, dental problem, ear pain, hearing loss, mouth sores, nosebleeds, sinus pressure, sore throat, tinnitus, trouble swallowing and voice change.   Eyes: Negative for photophobia, pain, redness and visual disturbance.  Respiratory: Negative for cough, chest tightness and shortness of breath.   Cardiovascular: Negative for chest pain, palpitations and leg swelling.   Gastrointestinal: Negative for nausea, vomiting, abdominal pain, diarrhea, constipation, blood in stool, abdominal distention and rectal pain.  Genitourinary: Negative for dysuria, urgency, frequency, hematuria, flank pain, vaginal bleeding, vaginal discharge, difficulty urinating, genital sores, vaginal pain, menstrual problem and pelvic pain.  Musculoskeletal: Negative for back pain, arthralgias and neck stiffness.  Skin: Negative for rash.  Neurological: Negative for dizziness, syncope, speech difficulty, weakness, light-headedness, numbness and headaches.  Hematological: Negative for adenopathy. Does not bruise/bleed easily.  Psychiatric/Behavioral: Negative for suicidal ideas, behavioral problems, self-injury, dysphoric mood and agitation. The patient is not nervous/anxious.        Objective:   Physical Exam  Constitutional: She is oriented to person, place, and time. She appears well-developed and well-nourished.  HENT:  Head: Normocephalic and atraumatic.  Right Ear: External ear normal.  Left Ear: External ear normal.  Mouth/Throat: Oropharynx is clear and moist.  Eyes: Conjunctivae and EOM are normal.  Neck: Normal range of motion. Neck supple. No JVD present. No thyromegaly present.  Cardiovascular: Normal rate, regular rhythm, normal heart sounds and intact distal pulses.   No murmur heard. Pulmonary/Chest: Effort normal and breath sounds normal. She has no wheezes. She has no rales.  Abdominal: Soft. Bowel sounds are normal. She exhibits no distension and no mass. There is no tenderness. There is no rebound and no guarding.  Musculoskeletal: Normal range of motion. She exhibits no edema or tenderness.  Neurological: She is alert and oriented to person, place, and time. She has normal reflexes. No cranial nerve deficit. She exhibits normal muscle tone. Coordination normal.  Skin: Skin is warm and dry. No rash noted.  Psychiatric: She has a normal mood and affect. Her behavior  is normal.          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  This was treated with cryotherapy without difficulty

## 2015-03-28 NOTE — Progress Notes (Signed)
Pre visit review using our clinic review tool, if applicable. No additional management support is needed unless otherwise documented below in the visit note. 

## 2015-04-21 ENCOUNTER — Other Ambulatory Visit: Payer: Commercial Managed Care - HMO | Admitting: Vascular Surgery

## 2015-04-28 ENCOUNTER — Ambulatory Visit: Payer: Commercial Managed Care - HMO | Admitting: Vascular Surgery

## 2015-04-28 ENCOUNTER — Encounter (HOSPITAL_COMMUNITY): Payer: Commercial Managed Care - HMO

## 2015-04-28 ENCOUNTER — Other Ambulatory Visit: Payer: Self-pay | Admitting: Internal Medicine

## 2015-04-30 ENCOUNTER — Encounter: Payer: Self-pay | Admitting: Vascular Surgery

## 2015-05-05 ENCOUNTER — Encounter: Payer: Self-pay | Admitting: Vascular Surgery

## 2015-05-05 ENCOUNTER — Ambulatory Visit (INDEPENDENT_AMBULATORY_CARE_PROVIDER_SITE_OTHER): Payer: Commercial Managed Care - HMO | Admitting: Vascular Surgery

## 2015-05-05 VITALS — BP 128/77 | HR 70 | Temp 98.0°F | Resp 16 | Ht 65.0 in | Wt 130.0 lb

## 2015-05-05 DIAGNOSIS — I83893 Varicose veins of bilateral lower extremities with other complications: Secondary | ICD-10-CM | POA: Diagnosis not present

## 2015-05-05 NOTE — Progress Notes (Signed)
Laser Ablation Procedure    Date: 05/05/2015   [name] DOB:04-May-1946  Consent signed: Yes    Surgeon:  Dr. Nelda Severe. Kellie Simmering  Procedure: Laser Ablation: right Greater Saphenous Vein  BP 128/77 mmHg  Pulse 70  Temp(Src) 98 F (36.7 C)  Resp 16  Ht 5\' 5"  (1.651 m)  Wt 130 lb (58.968 kg)  BMI 21.63 kg/m2  SpO2 100%  Tumescent Anesthesia: 300 cc 0.9% NaCl with 50 cc Lidocaine HCL with 1% Epi and 15 cc 8.4% NaHCO3  Local Anesthesia: 3 cc Lidocaine HCL and NaHCO3 (ratio 2:1)  Pulsed Mode: 15 watts, 539ms delay, 1.0 duration  Total Energy:    2260          Total Pulses:  151              Total Time: 2:31    Patient tolerated procedure well  Notes:   Description of Procedure:  After marking the course of the secondary varicosities, the patient was placed on the operating table in the supine position, and the right leg was prepped and draped in sterile fashion.   Local anesthetic was administered and under ultrasound guidance the saphenous vein was accessed with a micro needle and guide wire; then the mirco puncture sheath was placed.  A guide wire was inserted saphenofemoral junction , followed by a 5 french sheath.  The position of the sheath and then the laser fiber below the junction was confirmed using the ultrasound.  Tumescent anesthesia was administered along the course of the saphenous vein using ultrasound guidance. The patient was placed in Trendelenburg position and protective laser glasses were placed on patient and staff, and the laser was fired at 15 watts continuous mode advancing 1-16mm/second for a total of 2260 joules.     Steri strips were applied to the stab wounds and ABD pads and thigh high compression stockings were applied.  Ace wrap bandages were applied over the phlebectomy sites and at the top of the saphenofemoral junction. Blood loss was less than 15 cc.  The patient ambulated out of the operating room having tolerated the procedure well.

## 2015-05-05 NOTE — Progress Notes (Signed)
Subjective:     Patient ID: [name], female   DOB: Jul 21, 1946, 69 y.o.   MRN: [id number]  HPI 69 year old female had laser ablation of the right great saphenous vein from the proximal calf to near the saphenofemoral junction performed under local tumescent anesthesia. She tolerated the procedure well. Approximately 2200 J of energy was utilized.   Review of Systems     Objective:   Physical Exam BP 128/77 mmHg  Pulse 70  Temp(Src) 98 F (36.7 C)  Resp 16  Ht 5\' 5"  (1.651 m)  Wt 130 lb (58.968 kg)  BMI 21.63 kg/m2  SpO2 100%       Assessment:     Well-tolerated laser ablation right great saphenous vein performed under local tumescent anesthesia for gross reflux with painful varicosities    Plan:     Return in 1 week for venous duplex exam to confirm closure right great saphenous vein. We'll then proceed with similar procedure on contralateral left leg in 2 weeks.

## 2015-05-06 ENCOUNTER — Encounter: Payer: Self-pay | Admitting: Vascular Surgery

## 2015-05-06 ENCOUNTER — Telehealth: Payer: Self-pay | Admitting: *Deleted

## 2015-05-06 ENCOUNTER — Other Ambulatory Visit: Payer: Self-pay | Admitting: *Deleted

## 2015-05-06 DIAGNOSIS — I83893 Varicose veins of bilateral lower extremities with other complications: Secondary | ICD-10-CM

## 2015-05-06 NOTE — Telephone Encounter (Signed)
Asked the patient to call me if she has any questions or concerns.

## 2015-05-09 ENCOUNTER — Encounter: Payer: Self-pay | Admitting: Vascular Surgery

## 2015-05-12 ENCOUNTER — Ambulatory Visit (INDEPENDENT_AMBULATORY_CARE_PROVIDER_SITE_OTHER): Payer: Commercial Managed Care - HMO | Admitting: Vascular Surgery

## 2015-05-12 ENCOUNTER — Ambulatory Visit (HOSPITAL_COMMUNITY)
Admission: RE | Admit: 2015-05-12 | Discharge: 2015-05-12 | Disposition: A | Discharge status: Home / Self Care | Payer: Commercial Managed Care - HMO | Source: Ambulatory Visit | Attending: Vascular Surgery | Admitting: Vascular Surgery

## 2015-05-12 ENCOUNTER — Encounter: Payer: Self-pay | Admitting: Vascular Surgery

## 2015-05-12 VITALS — BP 115/71 | HR 56 | Temp 97.8°F | Resp 16 | Ht 65.0 in | Wt 130.0 lb

## 2015-05-12 DIAGNOSIS — I83893 Varicose veins of bilateral lower extremities with other complications: Secondary | ICD-10-CM | POA: Insufficient documentation

## 2015-05-12 NOTE — Progress Notes (Signed)
Subjective:     Patient ID: [name], female   DOB: Mar 17, 1946, 69 y.o.   MRN: [id number]  HPI this 69 year old female returns 1 week post-laser ablation right great saphenous vein performed for painful varicosities and swelling. She has had moderate discomfort in the distal portion of the right thigh and a moderate amount of bruising. She has worn elastic compression stocking and taken ibuprofen as instructed. Her pain is significantly better and she is up to ambulate well. She continues to have aching and throbbing discomfort in the contralateral left leg.  Past Medical History  Diagnosis Date  . 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 Varicose veins     History  Substance Use Topics  . Smoking status: Never Smoker   . Smokeless tobacco: Never Used  . Alcohol Use: 0.0 oz/week    0 Standard drinks or equivalent per week    Family History  Problem Relation Age of Onset  . Thyroid disease Mother   . Arthritis Mother   . Cancer Father     lung ca , cad, smoker  . Heart disease Father     Allergies  Allergen Reactions  . Penicillins      Current outpatient prescriptions:  .  estradiol (CLIMARA - DOSED IN MG/24 HR) 0.025 mg/24hr patch, Place 1 patch (0.025 mg total) onto the skin once a week., Disp: 12 patch, Rfl: 3 .  estradiol (CLIMARA - DOSED IN MG/24 HR) 0.025 mg/24hr patch, PLACE 1 PATCH (0.025 MG TOTAL) ONTO THE SKIN ONCE A WEEK., Disp: 4 patch, Rfl: 11 .  ibuprofen (ADVIL) 200 MG tablet, Take 400 mg by mouth every 6 (six) hours as needed for pain., Disp: , Rfl:   Filed Vitals:   05/12/15 1022  BP: 115/71  Pulse: 56  Temp: 97.8 F (36.6 C)  Resp: 16  Height: 5\' 5"  (1.651 m)  Weight: 130 lb (58.968 kg)  SpO2: 100%    Body mass index is 21.63 kg/(m^2).         Review of Systems denies chest pain, dyspnea on exertion, PND, orthopnea, hemoptysis,  claudication.     Objective:   Physical Exam BP 115/71 mmHg  Pulse 56  Temp(Src) 97.8 F (36.6 C)  Resp 16  Ht 5\' 5"  (1.651 m)  Wt 130 lb (58.968 kg)  BMI 21.63 kg/m2  SpO2 100%  Gen. well-developed well-nourished female in no apparent distress alert and oriented 3 Lungs no rhonchi or wheezing Cardiovascular regular rhythm no murmurs Right leg with mild tenderness to palpation of great saphenous vein. Moderate ecchymosis and mid medial and posterior thigh area. 3+ dorsalis pedis pulse palpable. No distal edema noted. Prominent reticular veins below the knee on the right leg.  Today I ordered a venous duplex exam of the right leg which I reviewed and interpreted. There is no DVT. There is total closure of the right great saphenous vein from the knee to near the saphenofemoral junction.     Assessment:     Successful laser ablation right great saphenous vein for painful varicosities and swelling    Plan:     Patient return next week for similar procedure and contralateral left leg Following that she will return in 3 months to see if stab phlebectomy or sclerotherapy will be indicated for residual varicosities

## 2015-05-16 ENCOUNTER — Encounter: Payer: Self-pay | Admitting: Vascular Surgery

## 2015-05-19 ENCOUNTER — Other Ambulatory Visit: Payer: Self-pay | Admitting: Vascular Surgery

## 2015-05-26 ENCOUNTER — Ambulatory Visit: Payer: Self-pay | Admitting: Vascular Surgery

## 2015-05-26 ENCOUNTER — Encounter (HOSPITAL_COMMUNITY): Payer: Self-pay

## 2015-05-27 ENCOUNTER — Ambulatory Visit: Payer: Self-pay | Admitting: Vascular Surgery

## 2015-05-27 ENCOUNTER — Encounter (HOSPITAL_COMMUNITY): Payer: Self-pay

## 2015-06-25 ENCOUNTER — Encounter: Payer: Self-pay | Admitting: Vascular Surgery

## 2015-06-30 ENCOUNTER — Other Ambulatory Visit: Payer: Self-pay | Admitting: Vascular Surgery

## 2015-07-07 ENCOUNTER — Ambulatory Visit: Payer: Self-pay | Admitting: Vascular Surgery

## 2015-07-07 ENCOUNTER — Encounter (HOSPITAL_COMMUNITY): Payer: Self-pay

## 2015-08-29 ENCOUNTER — Telehealth: Payer: Self-pay | Admitting: Oncology

## 2015-08-29 NOTE — Telephone Encounter (Signed)
Left a message with her 2017 appointment

## 2015-10-27 ENCOUNTER — Other Ambulatory Visit: Payer: Self-pay

## 2015-10-27 DIAGNOSIS — Z1231 Encounter for screening mammogram for malignant neoplasm of breast: Secondary | ICD-10-CM

## 2015-11-12 ENCOUNTER — Other Ambulatory Visit: Payer: Self-pay | Admitting: Oncology

## 2015-11-12 DIAGNOSIS — C786 Secondary malignant neoplasm of retroperitoneum and peritoneum: Secondary | ICD-10-CM

## 2015-11-13 ENCOUNTER — Telehealth: Payer: Self-pay | Admitting: Oncology

## 2015-11-13 ENCOUNTER — Ambulatory Visit: Payer: Commercial Managed Care - HMO | Admitting: Oncology

## 2015-11-13 ENCOUNTER — Other Ambulatory Visit (HOSPITAL_BASED_OUTPATIENT_CLINIC_OR_DEPARTMENT_OTHER): Payer: Commercial Managed Care - HMO

## 2015-11-13 VITALS — BP 103/71 | HR 70 | Temp 97.8°F | Resp 18 | Ht 65.0 in | Wt 133.5 lb

## 2015-11-13 DIAGNOSIS — Z8589 Personal history of malignant neoplasm of other organs and systems: Secondary | ICD-10-CM | POA: Diagnosis not present

## 2015-11-13 DIAGNOSIS — C786 Secondary malignant neoplasm of retroperitoneum and peritoneum: Secondary | ICD-10-CM

## 2015-11-13 LAB — COMPREHENSIVE METABOLIC PANEL
ALBUMIN: 4.2 g/dL (ref 3.5–5.0)
ALK PHOS: 52 U/L (ref 40–150)
ALT: 14 U/L (ref 0–55)
ANION GAP: 11 meq/L (ref 3–11)
AST: 18 U/L (ref 5–34)
BUN: 19.1 mg/dL (ref 7.0–26.0)
CALCIUM: 9.3 mg/dL (ref 8.4–10.4)
CO2: 25 mEq/L (ref 22–29)
CREATININE: 0.9 mg/dL (ref 0.6–1.1)
Chloride: 103 mEq/L (ref 98–109)
EGFR: 64 mL/min/{1.73_m2} — ABNORMAL LOW (ref >90)
Glucose: 115 mg/dl (ref 70–140)
POTASSIUM: 4.4 meq/L (ref 3.5–5.1)
Sodium: 140 mEq/L (ref 136–145)
Total Bilirubin: 0.63 mg/dL (ref 0.20–1.20)
Total Protein: 7.6 g/dL (ref 6.4–8.3)

## 2015-11-13 LAB — CBC WITH DIFFERENTIAL/PLATELET
BASO%: 0.6 % (ref 0.0–2.0)
BASOS ABS: 0 10*3/uL (ref 0.0–0.1)
EOS%: 2.4 % (ref 0.0–7.0)
Eosinophils Absolute: 0.1 10*3/uL (ref 0.0–0.5)
HEMATOCRIT: 41.3 % (ref 34.8–46.6)
HEMOGLOBIN: 13.7 g/dL (ref 11.6–15.9)
LYMPH#: 1.8 10*3/uL (ref 0.9–3.3)
LYMPH%: 30.3 % (ref 14.0–49.7)
MCH: 32 pg (ref 25.1–34.0)
MCHC: 33.1 g/dL (ref 31.5–36.0)
MCV: 96.5 fL (ref 79.5–101.0)
MONO#: 0.5 10*3/uL (ref 0.1–0.9)
MONO%: 8.8 % (ref 0.0–14.0)
NEUT#: 3.5 10*3/uL (ref 1.5–6.5)
NEUT%: 57.9 % (ref 38.4–76.8)
PLATELETS: 211 10*3/uL (ref 145–400)
RBC: 4.28 10*6/uL (ref 3.70–5.45)
RDW: 13.1 % (ref 11.2–14.5)
WBC: 6.1 10*3/uL (ref 3.9–10.3)

## 2015-11-13 NOTE — Telephone Encounter (Signed)
R/s 2/9 ov appointment per patient could not wait any longer had to go back to work

## 2015-11-14 LAB — CA 125: Cancer Antigen (CA) 125: 7.5 U/mL (ref 0.0–38.1)

## 2015-11-14 LAB — CANCER ANTIGEN 125 (PARALLEL TESTING): CA 125: 5 U/mL (ref <35)

## 2015-11-16 ENCOUNTER — Encounter: Payer: Self-pay | Admitting: Oncology

## 2015-11-16 NOTE — Progress Notes (Signed)
Medical Oncology  MD 25 min late for scheduled appointment due to acutely ill patient.  Mrs Quigg left after labs and vitals, prior to seeing MD, stating that she had to be at work and that she would reschedule appointment.  Encounter was already opened. No visit, no exam, no MD charge.  Godfrey Pick, MD

## 2015-11-18 ENCOUNTER — Telehealth: Payer: Self-pay

## 2015-11-18 NOTE — Telephone Encounter (Signed)
Spoke with [name] and told her the results of her labs as noted below by Dr. Marko Plume.  Apologized for the delay in appointment time last week as she had to reschedule appointment. [name] inquired as to wether or not she would have to pay another co-[ay for appointment 2-2--17.  Suggested she speak with the [facility] business  Office regarding co-payment issue.  Patient verbalized understanding.

## 2015-11-18 NOTE — Telephone Encounter (Signed)
-----   Message from Gordy Levan, MD sent at 11/17/2015 10:22 AM EST ----- Labs seen and need follow up: please let her know labs including ca 125 all good. I see she has rescheduled apt to 2-20 (I was late last week and she left)

## 2015-11-21 ENCOUNTER — Telehealth: Payer: Self-pay | Admitting: Oncology

## 2015-11-21 ENCOUNTER — Other Ambulatory Visit: Payer: Self-pay | Admitting: Oncology

## 2015-11-21 NOTE — Telephone Encounter (Signed)
per pof to sch pt appt-cld & leftt pt a message and adv of appt time & date

## 2015-11-22 ENCOUNTER — Telehealth: Payer: Self-pay | Admitting: Oncology

## 2015-11-22 ENCOUNTER — Other Ambulatory Visit: Payer: Self-pay | Admitting: Oncology

## 2015-11-22 NOTE — Telephone Encounter (Signed)
Per staff message from [place] moved 2/20 f/u from [facility] to [facility]. Left message informing patient.

## 2015-11-24 ENCOUNTER — Ambulatory Visit: Payer: Self-pay | Admitting: Oncology

## 2015-11-25 ENCOUNTER — Ambulatory Visit: Payer: Self-pay

## 2015-12-08 ENCOUNTER — Ambulatory Visit: Payer: Self-pay

## 2015-12-16 ENCOUNTER — Ambulatory Visit: Payer: Self-pay

## 2015-12-21 ENCOUNTER — Other Ambulatory Visit: Payer: Self-pay | Admitting: Oncology

## 2015-12-22 ENCOUNTER — Ambulatory Visit (HOSPITAL_BASED_OUTPATIENT_CLINIC_OR_DEPARTMENT_OTHER): Payer: Commercial Managed Care - HMO | Admitting: Oncology

## 2015-12-22 ENCOUNTER — Telehealth: Payer: Self-pay | Admitting: Oncology

## 2015-12-22 ENCOUNTER — Encounter: Payer: Self-pay | Admitting: Oncology

## 2015-12-22 VITALS — BP 152/86 | HR 62 | Temp 97.6°F | Resp 18 | Ht 65.0 in | Wt 131.8 lb

## 2015-12-22 DIAGNOSIS — Z8589 Personal history of malignant neoplasm of other organs and systems: Secondary | ICD-10-CM

## 2015-12-22 DIAGNOSIS — J3089 Other allergic rhinitis: Secondary | ICD-10-CM | POA: Diagnosis not present

## 2015-12-22 DIAGNOSIS — Z7189 Other specified counseling: Secondary | ICD-10-CM

## 2015-12-22 DIAGNOSIS — Z9049 Acquired absence of other specified parts of digestive tract: Secondary | ICD-10-CM | POA: Diagnosis not present

## 2015-12-22 DIAGNOSIS — C786 Secondary malignant neoplasm of retroperitoneum and peritoneum: Secondary | ICD-10-CM

## 2015-12-22 DIAGNOSIS — Z79890 Hormone replacement therapy (postmenopausal): Secondary | ICD-10-CM | POA: Diagnosis not present

## 2015-12-22 NOTE — Progress Notes (Signed)
OFFICE PROGRESS NOTE   December 22, 2015   Physicians: (J.Granfortuna), [place]; S.Tafeen, Nancy Marus)  INTERVAL HISTORY:  Patient is seen, alone for visit, this rescheduled at her request from 11-13-15 (labs done that day). She continues on observation thru this office for pseudomyxoma peritonei, which was treated surgically in [place] in 2004.  Primary care is by Dr Nonie Hoyer, whom she sees every 6-12 months. Last screening colonoscopy was 2008, apparently not seen by [facility] ~ 11-2014.  Apparently did not see gyn after my last visit 11-2014. She is no longer followed by gyn oncology (saw Dr Nancy Marus after transferring care to [facility]).   Patient has no abdominal, GI or pelvic complaints. Energy and appetite are good. She has had 4 weeks of clear rhinorrhea, no otitis symptoms, no fever, no lower respiratory symptoms, minimally improved with "tylenol allergy", has never tried claritin/ flonase or equivalent.  She has been on estradiol x 20 years (or at least since 2004 surgery)  for hot flashes. She most recently tried to stop this 2 months ago, stayed off x 4-6 weeks prior to hot flashes at night, resumed with those symptoms.  Otherwise no new or different pain, no bleeding, no LE swelling, no SOB, bladder ok.  Remainder of 10 point Review of Systems negative  No central catheter Flu vaccine 06-25-15  ONCOLOGIC HISTORY She was evaluated in June of 2004 for left lower quadrant abdominal pain, initially was felt to be an ovarian cyst. She underwent a TAH/BSO with omentectomy at the [facility] in [place], [place] on 03/11/03. Additional pathology was obvious at time of surgical exploration. A small amount of mucinous ascites was found. This was limited to the area of the cul-de-sac. A large 10 cm ovarian mass was removed. A smaller lesion was excised from the right ovary and tube. Both of the ovaries were noted to have mucoid lesions in several areas. Additional  lesions were seen on the uterus. The omentum was resected and appeared negative grossly. A radical bilateral pelvic lymphadenectomy was done. A number of mucinous lesions were seen in multiple areas in the right upper abdominal quadrant near the right diaphragm and along the small bowel and the areas in the cul-de-sac and on the bladder. A peritoneotomy was performed. All the mucinous lesions were meticulously identified and resected. The appendix appeared pathologic, grossly dilated and leaking mucinous fluid from its tip. A general surgeon was called in on the case and a right hemicolectomy was performed. 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 Thirteen pericolic lymph nodes were negative as were lymph nodes taken from the right and left pelvis.  Preop tumor markers showed mild elevation of CA-125, 45 units, normal less than 21, done 02/27/03. Subsequent CEAs done postop were all undetectable, less than 0.5, done 04/04/03 and subsequent levels also normal.  She was followed by clinical exam, periodic CT scans and PET scans since diagnisis, with no gross evidence for recurrent disease. Most recent CT scan report available prior to her moving to [facility] done 04/27/04 was unremarkable. PET scan done one month after her surgery on 04/05/03 showed no focal abnormal uptake. Last imaging in this EMR was CT AP 03-2008 and last PET in this EMR 10-2004.   Objective:  Vital signs in last 24 hours:  BP 152/86 mmHg  Pulse 62  Temp(Src) 97.6 F (36.4 C) (Oral)  Resp 18  Ht 5' 5"  (1.651 m)  Wt 131 lb 12.8 oz (59.784 kg)  BMI 21.93 kg/m2  SpO2 100% Weight down 6 lbs Alert, oriented and appropriate. Ambulatory without difficulty. Slightly nasally congested, does not appear ill. Very pleasant and cooperative  HEENT:PERRL, sclerae not icteric. Oral mucosa moist without lesions, posterior pharynx clear. Nasal turbinates with clear drainage. TMs slightly dull no  erythema Neck supple. No JVD.  Lymphatics:no cervical,supraclavicular, axillary or inguinal adenopathy Resp: clear to auscultation bilaterally and normal percussion bilaterally Cardio: regular rate and rhythm. No gallop. GI: soft, nontender, not distended, no mass or organomegaly. Normally active bowel sounds. Surgical incision not remarkable. Musculoskeletal/ Extremities: without pitting edema, cords, tenderness Neuro: no peripheral neuropathy. Otherwise nonfocal. PSYCH appropriate mood and affect Skin without rash, ecchymosis, petechiae Breasts: bilaterally without dominant mass, skin or nipple findings. Axillae benign.   Lab Results:  Results for orders placed or performed in visit on 11/13/15  CBC with Differential  Result Value Ref Range   WBC 6.1 3.9 - 10.3 10e3/uL   NEUT# 3.5 1.5 - 6.5 10e3/uL   HGB 13.7 11.6 - 15.9 g/dL   HCT 41.3 34.8 - 46.6 %   Platelets 211 145 - 400 10e3/uL   MCV 96.5 79.5 - 101.0 fL   MCH 32.0 25.1 - 34.0 pg   MCHC 33.1 31.5 - 36.0 g/dL   RBC 4.28 3.70 - 5.45 10e6/uL   RDW 13.1 11.2 - 14.5 %   lymph# 1.8 0.9 - 3.3 10e3/uL   MONO# 0.5 0.1 - 0.9 10e3/uL   Eosinophils Absolute 0.1 0.0 - 0.5 10e3/uL   Basophils Absolute 0.0 0.0 - 0.1 10e3/uL   NEUT% 57.9 38.4 - 76.8 %   LYMPH% 30.3 14.0 - 49.7 %   MONO% 8.8 0.0 - 14.0 %   EOS% 2.4 0.0 - 7.0 %   BASO% 0.6 0.0 - 2.0 %  Comprehensive metabolic panel  Result Value Ref Range   Sodium 140 136 - 145 mEq/L   Potassium 4.4 3.5 - 5.1 mEq/L   Chloride 103 98 - 109 mEq/L   CO2 25 22 - 29 mEq/L   Glucose 115 70 - 140 mg/dl   BUN 19.1 7.0 - 26.0 mg/dL   Creatinine 0.9 0.6 - 1.1 mg/dL   Total Bilirubin 0.63 0.20 - 1.20 mg/dL   Alkaline Phosphatase 52 40 - 150 U/L   AST 18 5 - 34 U/L   ALT 14 0 - 55 U/L   Total Protein 7.6 6.4 - 8.3 g/dL   Albumin 4.2 3.5 - 5.0 g/dL   Calcium 9.3 8.4 - 10.4 mg/dL   Anion Gap 11 3 - 11 mEq/L   EGFR 64 (L) >90 ml/min/1.73 m2  CA 125  Result Value Ref Range   Cancer  Antigen (CA) 125 7.5 0.0 - 38.1 U/mL  CA 125 (Parallel Testing)  Result Value Ref Range   CA 125 5 <35 U/mL    Labs all reviewed with patient and copies given to her now  Studies/Results:  No results found.  Due mammograms and DEXA, orders already in EMR, patient should call Breast Center to request these be done at same visit.  Last mammograms 10-2014 scattered fibroglandular densities  Medications: I have reviewed the patient's current medications. Discussed concerns for long term estrogen supplementation, which were not recognized when she began that drug >20 years ago. Recommended VIt E 800 mg at hs, possibly benadryl at hs to help sleep. For allergies suggested claritin/ lortadine or similar, could add flonase if needed, benadryl at hs.  DISCUSSION Inerval history as above Meds as  above. Encouraged her to DC estrogen patch, discussed keeping bedroom cooler at hs, plenty of fluids, Vit E and benadryl as above. Discussed environmental allergies.     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 Clinically doing well, agree with no regular scans now. WIll see her back in a year, or sooner if needed. May go to prn follow up at this office if remains stable then 2.Last colonoscopy 2008.  Note she has concerns about prep, but apparently consensus is not due again yet.  3.no recent DEXA, tho very active and reportedly good bone density some years ago. Dr Nonie Hoyer has DEXA ordered, which she will try to coordinate with mammograms. 4. Environmental allergies as above 5.use of exogenous estrogen since at least 2004: she is interested in trying to stop this. Plan as above.   All questions answered. Time spent 20 min including >50% counseling and coordination of care.    Julieann Drummonds P, MD   12/22/2015, 6:20 PM

## 2015-12-22 NOTE — Telephone Encounter (Signed)
appt made and avs printed °

## 2015-12-23 DIAGNOSIS — J3089 Other allergic rhinitis: Secondary | ICD-10-CM | POA: Insufficient documentation

## 2015-12-23 DIAGNOSIS — Z9049 Acquired absence of other specified parts of digestive tract: Secondary | ICD-10-CM | POA: Insufficient documentation

## 2015-12-23 DIAGNOSIS — Z7189 Other specified counseling: Secondary | ICD-10-CM | POA: Insufficient documentation

## 2015-12-24 ENCOUNTER — Other Ambulatory Visit: Payer: Self-pay | Admitting: Oncology

## 2015-12-24 ENCOUNTER — Telehealth: Payer: Self-pay

## 2015-12-24 DIAGNOSIS — C786 Secondary malignant neoplasm of retroperitoneum and peritoneum: Secondary | ICD-10-CM

## 2015-12-24 NOTE — Telephone Encounter (Signed)
Pt called stating the breast center does not have DEXA order. Called the breast center and the HM DEXA that Dr Salvadore Farber ordered was the wrong order. Correct order placed per Dr Edwyna Shell OV note.

## 2015-12-25 ENCOUNTER — Other Ambulatory Visit: Payer: Self-pay

## 2015-12-25 DIAGNOSIS — E2839 Other primary ovarian failure: Secondary | ICD-10-CM

## 2016-01-22 ENCOUNTER — Other Ambulatory Visit: Payer: Self-pay | Admitting: Oncology

## 2016-01-22 ENCOUNTER — Telehealth: Payer: Self-pay

## 2016-01-22 DIAGNOSIS — E2839 Other primary ovarian failure: Secondary | ICD-10-CM

## 2016-01-22 NOTE — Telephone Encounter (Signed)
Faxed signed order dated 01-22-16 to the [facility].  Sent a copy to be scanned into patient's EMR.

## 2016-01-23 ENCOUNTER — Ambulatory Visit
Admission: RE | Admit: 2016-01-23 | Discharge: 2016-01-23 | Disposition: A | Discharge status: Home / Self Care | Payer: Commercial Managed Care - HMO | Source: Ambulatory Visit | Attending: Oncology | Admitting: Oncology

## 2016-01-23 ENCOUNTER — Ambulatory Visit
Admission: RE | Admit: 2016-01-23 | Discharge: 2016-01-23 | Disposition: A | Discharge status: Home / Self Care | Payer: Commercial Managed Care - HMO | Source: Ambulatory Visit

## 2016-01-23 DIAGNOSIS — E2839 Other primary ovarian failure: Secondary | ICD-10-CM

## 2016-01-23 DIAGNOSIS — Z1231 Encounter for screening mammogram for malignant neoplasm of breast: Secondary | ICD-10-CM

## 2016-01-27 ENCOUNTER — Other Ambulatory Visit: Payer: Self-pay | Admitting: Oncology

## 2016-01-27 DIAGNOSIS — R928 Other abnormal and inconclusive findings on diagnostic imaging of breast: Secondary | ICD-10-CM

## 2016-01-29 ENCOUNTER — Ambulatory Visit
Admission: RE | Admit: 2016-01-29 | Discharge: 2016-01-29 | Disposition: A | Discharge status: Home / Self Care | Payer: Commercial Managed Care - HMO | Source: Ambulatory Visit | Attending: Oncology | Admitting: Oncology

## 2016-01-29 DIAGNOSIS — R928 Other abnormal and inconclusive findings on diagnostic imaging of breast: Secondary | ICD-10-CM

## 2016-01-30 ENCOUNTER — Other Ambulatory Visit: Payer: Self-pay | Admitting: Oncology

## 2016-01-30 DIAGNOSIS — R928 Other abnormal and inconclusive findings on diagnostic imaging of breast: Secondary | ICD-10-CM

## 2016-02-03 ENCOUNTER — Telehealth: Payer: Self-pay

## 2016-02-03 NOTE — Telephone Encounter (Signed)
Gave results of the bone density scan as noted below by Dr. Marko Plume to husband as [name] was not at home.  Patient received report of mammograms while at the [facility].on 01-29-16. Ms. Lazor can call Dr. Mariana Kaufman office if she has any questions or concerns.

## 2016-02-03 NOTE — Telephone Encounter (Signed)
-----   Message from Gordy Levan, MD sent at 01/27/2016  8:10 AM EDT ----- Labs seen and need follow up: bone density just a little low, osteopenic, ok to be off estrogen patch as discussed. Breast Center getting follow up mammogram, so can let her know this after mammo result

## 2016-05-14 ENCOUNTER — Other Ambulatory Visit: Payer: Self-pay | Admitting: Internal Medicine

## 2016-08-20 ENCOUNTER — Telehealth: Payer: Self-pay

## 2016-08-20 NOTE — Telephone Encounter (Signed)
Call per TPN to schedule AWV / CPE  CPE originally scheduled with DR K on Dec 15th but was canceled as the patient works and needs to come in at [place] or at [place] or after  Did keep 8 am lab apt and will have labs drawn and then stay for AWV shortly thereafter.  Will call and try to reschedule CPE with Dr. Raliegh Ip

## 2016-08-31 NOTE — Telephone Encounter (Signed)
fup with Ms Hegger regarding MD cpe apt LVM that labs have to be completed within 30 days of draw and Dr Ks schedule is full, Can cancel labs and schedule with CPE the first of the year with Dr. Raliegh Ip;   Asked about keeping the AWV apt;

## 2016-09-10 ENCOUNTER — Ambulatory Visit: Payer: Commercial Managed Care - HMO

## 2016-09-10 ENCOUNTER — Other Ambulatory Visit: Payer: Self-pay
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
## 2016-09-17 ENCOUNTER — Encounter: Payer: Self-pay | Admitting: Internal Medicine

## 2016-11-02 ENCOUNTER — Encounter: Payer: Self-pay | Admitting: Internal Medicine

## 2016-12-17 ENCOUNTER — Other Ambulatory Visit: Payer: Self-pay | Admitting: Hematology and Oncology

## 2016-12-17 DIAGNOSIS — C786 Secondary malignant neoplasm of retroperitoneum and peritoneum: Secondary | ICD-10-CM

## 2016-12-20 ENCOUNTER — Other Ambulatory Visit (HOSPITAL_BASED_OUTPATIENT_CLINIC_OR_DEPARTMENT_OTHER): Payer: PPO

## 2016-12-20 ENCOUNTER — Ambulatory Visit (HOSPITAL_BASED_OUTPATIENT_CLINIC_OR_DEPARTMENT_OTHER): Payer: PPO | Admitting: Hematology and Oncology

## 2016-12-20 ENCOUNTER — Encounter: Payer: Self-pay | Admitting: Hematology and Oncology

## 2016-12-20 DIAGNOSIS — Z7189 Other specified counseling: Secondary | ICD-10-CM

## 2016-12-20 DIAGNOSIS — Z8589 Personal history of malignant neoplasm of other organs and systems: Secondary | ICD-10-CM

## 2016-12-20 DIAGNOSIS — C786 Secondary malignant neoplasm of retroperitoneum and peritoneum: Secondary | ICD-10-CM

## 2016-12-20 LAB — CBC WITH DIFFERENTIAL/PLATELET
BASO%: 0.6 % (ref 0.0–2.0)
Basophils Absolute: 0 10*3/uL (ref 0.0–0.1)
EOS ABS: 0.2 10*3/uL (ref 0.0–0.5)
EOS%: 3 % (ref 0.0–7.0)
HCT: 43 % (ref 34.8–46.6)
HGB: 14.6 g/dL (ref 11.6–15.9)
LYMPH%: 29.5 % (ref 14.0–49.7)
MCH: 32.7 pg (ref 25.1–34.0)
MCHC: 34 g/dL (ref 31.5–36.0)
MCV: 96.3 fL (ref 79.5–101.0)
MONO#: 0.5 10*3/uL (ref 0.1–0.9)
MONO%: 10.2 % (ref 0.0–14.0)
NEUT%: 56.7 % (ref 38.4–76.8)
NEUTROS ABS: 2.8 10*3/uL (ref 1.5–6.5)
PLATELETS: 177 10*3/uL (ref 145–400)
RBC: 4.46 10*6/uL (ref 3.70–5.45)
RDW: 13.1 % (ref 11.2–14.5)
WBC: 5 10*3/uL (ref 3.9–10.3)
lymph#: 1.5 10*3/uL (ref 0.9–3.3)

## 2016-12-20 LAB — COMPREHENSIVE METABOLIC PANEL
ALT: 16 U/L (ref 0–55)
ANION GAP: 8 meq/L (ref 3–11)
AST: 19 U/L (ref 5–34)
Albumin: 4.2 g/dL (ref 3.5–5.0)
Alkaline Phosphatase: 49 U/L (ref 40–150)
BILIRUBIN TOTAL: 0.73 mg/dL (ref 0.20–1.20)
BUN: 23 mg/dL (ref 7.0–26.0)
CHLORIDE: 108 meq/L (ref 98–109)
CO2: 25 mEq/L (ref 22–29)
Calcium: 9.8 mg/dL (ref 8.4–10.4)
Creatinine: 1 mg/dL (ref 0.6–1.1)
EGFR: 58 mL/min/{1.73_m2} — AB (ref >90)
GLUCOSE: 69 mg/dL — AB (ref 70–140)
Potassium: 4.6 mEq/L (ref 3.5–5.1)
SODIUM: 141 meq/L (ref 136–145)
TOTAL PROTEIN: 7.5 g/dL (ref 6.4–8.3)

## 2016-12-20 NOTE — Assessment & Plan Note (Signed)
The patient has been somewhat reluctant to discontinue hormone replacement therapy permanently. I recommend she discuss this with her primary care doctor for further management. I recommend aspirin therapy to reduce risk of thrombosis if she plans to remain on chronic  estrogen replacement therapy

## 2016-12-20 NOTE — Assessment & Plan Note (Signed)
The patient is a long-term cancer survivor, almost 14 years out from her primary surgery. I recommend she follows with GI this year for repeat colonoscopy. She has no signs or symptoms to suggest cancer recurrence. There is no role for surveillance imaging study. I recommend discharge from the cancer center and she agreed with the plan of care

## 2016-12-20 NOTE — Progress Notes (Signed)
[facility] progress notes  Patient Care Team: Marletta Lor, MD as PCP - General Lavonna Monarch, MD as Consulting Physician (Dermatology)  CHIEF COMPLAINTS/PURPOSE OF VISIT:  History of pseudomyxoma peritonei  HISTORY OF PRESENTING ILLNESS:  [name] 71 y.o. female was transferred to my care after her prior physician has left.  I reviewed the patient's records extensive and collaborated the history with the patient. Summary of her history is as follows: ONCOLOGIC HISTORY She was evaluated in June of 2004 for left lower quadrant abdominal pain, initially was felt to be an ovarian cyst. She underwent a TAH/BSO with omentectomy at the [facility] in [place], [place] on 03/11/03. Additional pathology was obvious at time of surgical exploration. A small amount of mucinous ascites was found. This was limited to the area of the cul-de-sac. A large 10 cm ovarian mass was removed. A smaller lesion was excised from the right ovary and tube. Both of the ovaries were noted to have mucoid lesions in several areas. Additional lesions were seen on the uterus. The omentum was resected and appeared negative grossly. A radical bilateral pelvic lymphadenectomy was done. A number of mucinous lesions were seen in multiple areas in the right upper abdominal quadrant near the right diaphragm and along the small bowel and the areas in the cul-de-sac and on the bladder. A peritoneotomy was performed. All the mucinous lesions were meticulously identified and resected. The appendix appeared pathologic, grossly dilated and leaking mucinous fluid from its tip. A general surgeon was called in on the case and a right hemicolectomy was performed. 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 Thirteen pericolic lymph nodes were negative as were lymph nodes taken from the right and left pelvis.  Preop tumor markers showed  mild elevation of CA-125, 45 units, normal less than 21, done 02/27/03. Subsequent CEAs done postop were all undetectable, less than 0.5, done 04/04/03 and subsequent levels also normal.  She was followed by clinical exam, periodic CT scans and PET scans since diagnisis, with no gross evidence for recurrent disease. Most recent CT scan report available prior to her moving to [facility] done 04/27/04 was unremarkable. PET scan done one month after her surgery on 04/05/03 showed no focal abnormal uptake. Last imaging in this EMR was CT AP 03-2008 and last PET in this EMR 10-2004.  Her last imaging study was in 2009 that was negative for recurrence. Last surveillance colonoscopy was in 2008. She denies abdominal bloating. No changes in recent bowel habits. Appetite is stable. She complains of insomnia recently when she tried to stop her long-term HRT. She denies significant hot flashes. Most of her age-appropriate screening modality are up-to-date  MEDICAL HISTORY:  Past Medical History:  Diagnosis Date  . 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 Varicose veins     SURGICAL HISTORY: Past Surgical History:  Procedure Laterality Date  . ABDOMINAL HYSTERECTOMY    . APPENDECTOMY    . COLECTOMY      SOCIAL HISTORY: Social History   Social History  . Marital status: Married    Spouse name: N/A  . Number of children: 1  . Years of education: N/A   Occupational History  . coordinator    Social History Main Topics  . Smoking status: Never Smoker  . Smokeless tobacco: Never Used  . Alcohol use 0.0 oz/week  . Drug use:  No  . Sexual activity: Not on file   Other Topics Concern  . Not on file   Social History Narrative  . No narrative on file    FAMILY HISTORY: Family History  Problem Relation Age of Onset  . Thyroid disease Mother   . Arthritis Mother   . Cancer Father      lung ca , cad, smoker  . Heart disease Father     ALLERGIES:  is allergic to penicillins.  MEDICATIONS:  Current Outpatient Prescriptions  Medication Sig Dispense Refill  . ibuprofen (ADVIL) 200 MG tablet Take 400 mg by mouth every 6 (six) hours as needed for pain. Reported on 12/22/2015    . estradiol (CLIMARA - DOSED IN MG/24 HR) 0.025 mg/24hr patch PLACE 1 PATCH (0.025 MG TOTAL) ONTO THE SKIN ONCE A WEEK. (Patient not taking: Reported on 12/20/2016) 4 patch 10   No current facility-administered medications for this visit.     REVIEW OF SYSTEMS:   Constitutional: Denies fevers, chills or abnormal night sweats Eyes: Denies blurriness of vision, double vision or watery eyes Ears, nose, mouth, throat, and face: Denies mucositis or sore throat Respiratory: Denies cough, dyspnea or wheezes Cardiovascular: Denies palpitation, chest discomfort or lower extremity swelling Gastrointestinal:  Denies nausea, heartburn or change in bowel habits Skin: Denies abnormal skin rashes Lymphatics: Denies new lymphadenopathy or easy bruising Neurological:Denies numbness, tingling or new weaknesses Behavioral/Psych: Mood is stable, no new changes  All other systems were reviewed with the patient and are negative.  PHYSICAL EXAMINATION: ECOG PERFORMANCE STATUS: 0 - Asymptomatic  Vitals:   12/20/16 1004  BP: 119/66  Pulse: (!) 58  Resp: 18  Temp: 97.7 F (36.5 C)   Filed Weights   12/20/16 1004  Weight: 134 lb 3.2 oz (60.9 kg)    GENERAL:alert, no distress and comfortable SKIN: skin color, texture, turgor are normal, no rashes or significant lesions EYES: normal, conjunctiva are pink and non-injected, sclera clear OROPHARYNX:no exudate, normal lips, buccal mucosa, and tongue  NECK: supple, thyroid normal size, non-tender, without nodularity LYMPH:  no palpable lymphadenopathy in the cervical, axillary or inguinal LUNGS: clear to auscultation and percussion with normal breathing  effort HEART: regular rate & rhythm and no murmurs without lower extremity edema ABDOMEN:abdomen soft, non-tender and normal bowel sounds.  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 I recommend she follows with GI this year for repeat colonoscopy. She has no signs or symptoms to suggest cancer recurrence. There is no role for surveillance imaging study. I recommend discharge from the cancer center and she agreed with the plan of care  Counseling for estrogen replacement therapy The patient has been somewhat reluctant to discontinue hormone replacement therapy permanently. I recommend she discuss this  with her primary care doctor for further management. I recommend aspirin therapy to reduce risk of thrombosis if she plans to remain on chronic  estrogen replacement therapy   No orders of the defined types were placed in this encounter.   All questions were answered. The patient knows to call the clinic with any problems, questions or concerns. I spent 20 minutes counseling the patient face to face. The total time spent in the appointment was 25 minutes and more than 50% was on counseling.     Heath Lark, MD 12/20/2016 11:17 AM

## 2017-01-17 ENCOUNTER — Telehealth: Payer: Self-pay

## 2017-01-17 NOTE — Telephone Encounter (Signed)
Received PA request from pharmacy for [place]. PA submitted & is pending. Key: [id number]

## 2017-01-19 NOTE — Telephone Encounter (Signed)
PA approved, form faxed back to pharmacy. 

## 2017-01-28 ENCOUNTER — Ambulatory Visit (INDEPENDENT_AMBULATORY_CARE_PROVIDER_SITE_OTHER): Payer: PPO | Admitting: Internal Medicine

## 2017-01-28 ENCOUNTER — Other Ambulatory Visit: Payer: Self-pay | Admitting: Internal Medicine

## 2017-01-28 ENCOUNTER — Encounter: Payer: Self-pay | Admitting: Internal Medicine

## 2017-01-28 VITALS — BP 138/72 | HR 62 | Temp 97.7°F | Ht 65.0 in | Wt 132.0 lb

## 2017-01-28 DIAGNOSIS — Z0000 Encounter for general adult medical examination without abnormal findings: Secondary | ICD-10-CM

## 2017-01-28 DIAGNOSIS — E785 Hyperlipidemia, unspecified: Secondary | ICD-10-CM | POA: Diagnosis not present

## 2017-01-28 DIAGNOSIS — R7989 Other specified abnormal findings of blood chemistry: Secondary | ICD-10-CM

## 2017-01-28 LAB — LIPID PANEL
CHOLESTEROL: 206 mg/dL — AB (ref 0–200)
HDL: 70.1 mg/dL (ref >39.00)
LDL CALC: 121 mg/dL — AB (ref 0–99)
NonHDL: 136.21
TRIGLYCERIDES: 78 mg/dL (ref 0.0–149.0)
Total CHOL/HDL Ratio: 3
VLDL: 15.6 mg/dL (ref 0.0–40.0)

## 2017-01-28 LAB — TSH: TSH: 6.38 u[IU]/mL — AB (ref 0.35–4.50)

## 2017-01-28 MED ORDER — ASPIRIN 81 MG PO TABS: 81.0000 mg | ORAL_TABLET | Freq: Every day | ORAL | Status: DC

## 2017-01-28 NOTE — Patient Instructions (Addendum)
Aspirin 81 mg daily  Schedule your colonoscopy to help detect colon cancer.  Schedule your mammogram.     Health Maintenance for Postmenopausal Women Menopause is a normal process in which your reproductive ability comes to an end. This process happens gradually over a span of months to years, usually between the ages of 65 and 72. Menopause is complete when you have missed 12 consecutive menstrual periods. It is important to talk with your health care provider about some of the most common conditions that affect postmenopausal women, such as heart disease, cancer, and bone loss (osteoporosis). Adopting a healthy lifestyle and getting preventive care can help to promote your health and wellness. Those actions can also lower your chances of developing some of these common conditions. What should I know about menopause? During menopause, you may experience a number of symptoms, such as:  Moderate-to-severe hot flashes.  Night sweats.  Decrease in sex drive.  Mood swings.  Headaches.  Tiredness.  Irritability.  Memory problems.  Insomnia. Choosing to treat or not to treat menopausal changes is an individual decision that you make with your health care provider. What should I know about hormone replacement therapy and supplements? Hormone therapy products are effective for treating symptoms that are associated with menopause, such as hot flashes and night sweats. Hormone replacement carries certain risks, especially as you become older. If you are thinking about using estrogen or estrogen with progestin treatments, discuss the benefits and risks with your health care provider. What should I know about heart disease and stroke? Heart disease, heart attack, and stroke become more likely as you age. This may be due, in part, to the hormonal changes that your body experiences during menopause. These can affect how your body processes dietary fats, triglycerides, and cholesterol. Heart  attack and stroke are both medical emergencies. There are many things that you can do to help prevent heart disease and stroke:  Have your blood pressure checked at least every 1-2 years. High blood pressure causes heart disease and increases the risk of stroke.  If you are 69-56 years old, ask your health care provider if you should take aspirin to prevent a heart attack or a stroke.  Do not use any tobacco products, including cigarettes, chewing tobacco, or electronic cigarettes. If you need help quitting, ask your health care provider.  It is important to eat a healthy diet and maintain a healthy weight.  Be sure to include plenty of vegetables, fruits, low-fat dairy products, and lean protein.  Avoid eating foods that are high in solid fats, added sugars, or salt (sodium).  Get regular exercise. This is one of the most important things that you can do for your health.  Try to exercise for at least 150 minutes each week. The type of exercise that you do should increase your heart rate and make you sweat. This is known as moderate-intensity exercise.  Try to do strengthening exercises at least twice each week. Do these in addition to the moderate-intensity exercise.  Know your numbers.Ask your health care provider to check your cholesterol and your blood glucose. Continue to have your blood tested as directed by your health care provider. What should I know about cancer screening? There are several types of cancer. Take the following steps to reduce your risk and to catch any cancer development as early as possible. Breast Cancer  Practice breast self-awareness.  This means understanding how your breasts normally appear and feel.  It also means doing regular breast  self-exams. Let your health care provider know about any changes, no matter how small.  If you are 51 or older, have a clinician do a breast exam (clinical breast exam or CBE) every year. Depending on your age, family  history, and medical history, it may be recommended that you also have a yearly breast X-ray (mammogram).  If you have a family history of breast cancer, talk with your health care provider about genetic screening.  If you are at high risk for breast cancer, talk with your health care provider about having an MRI and a mammogram every year.  Breast cancer (BRCA) gene test is recommended for women who have family members with BRCA-related cancers. Results of the assessment will determine the need for genetic counseling and BRCA1 and for BRCA2 testing. BRCA-related cancers include these types:  Breast. This occurs in males or females.  Ovarian.  Tubal. This may also be called fallopian tube cancer.  Cancer of the abdominal or pelvic lining (peritoneal cancer).  Prostate.  Pancreatic. Cervical, Uterine, and Ovarian Cancer  Your health care provider may recommend that you be screened regularly for cancer of the pelvic organs. These include your ovaries, uterus, and vagina. This screening involves a pelvic exam, which includes checking for microscopic changes to the surface of your cervix (Pap test).  For women ages 21-65, health care providers may recommend a pelvic exam and a Pap test every three years. For women ages 17-65, they may recommend the Pap test and pelvic exam, combined with testing for human papilloma virus (HPV), every five years. Some types of HPV increase your risk of cervical cancer. Testing for HPV may also be done on women of any age who have unclear Pap test results.  Other health care providers may not recommend any screening for nonpregnant women who are considered low risk for pelvic cancer and have no symptoms. Ask your health care provider if a screening pelvic exam is right for you.  If you have had past treatment for cervical cancer or a condition that could lead to cancer, you need Pap tests and screening for cancer for at least 20 years after your treatment. If Pap  tests have been discontinued for you, your risk factors (such as having a new sexual partner) need to be reassessed to determine if you should start having screenings again. Some women have medical problems that increase the chance of getting cervical cancer. In these cases, your health care provider may recommend that you have screening and Pap tests more often.  If you have a family history of uterine cancer or ovarian cancer, talk with your health care provider about genetic screening.  If you have vaginal bleeding after reaching menopause, tell your health care provider.  There are currently no reliable tests available to screen for ovarian cancer. Lung Cancer  Lung cancer screening is recommended for adults 47-24 years old who are at high risk for lung cancer because of a history of smoking. A yearly low-dose CT scan of the lungs is recommended if you:  Currently smoke.  Have a history of at least 30 pack-years of smoking and you currently smoke or have quit within the past 15 years. A pack-year is smoking an average of one pack of cigarettes per day for one year. Yearly screening should:  Continue until it has been 15 years since you quit.  Stop if you develop a health problem that would prevent you from having lung cancer treatment. Colorectal Cancer  This type of  cancer can be detected and can often be prevented.  Routine colorectal cancer screening usually begins at age 71 and continues through age 68.  If you have risk factors for colon cancer, your health care provider may recommend that you be screened at an earlier age.  If you have a family history of colorectal cancer, talk with your health care provider about genetic screening.  Your health care provider may also recommend using home test kits to check for hidden blood in your stool.  A small camera at the end of a tube can be used to examine your colon directly (sigmoidoscopy or colonoscopy). This is done to check for  the earliest forms of colorectal cancer.  Direct examination of the colon should be repeated every 5-10 years until age 17. However, if early forms of precancerous polyps or small growths are found or if you have a family history or genetic risk for colorectal cancer, you may need to be screened more often. Skin Cancer  Check your skin from head to toe regularly.  Monitor any moles. Be sure to tell your health care provider:  About any new moles or changes in moles, especially if there is a change in a mole's shape or color.  If you have a mole that is larger than the size of a pencil eraser.  If any of your family members has a history of skin cancer, especially at a young age, talk with your health care provider about genetic screening.  Always use sunscreen. Apply sunscreen liberally and repeatedly throughout the day.  Whenever you are outside, protect yourself by wearing long sleeves, pants, a wide-brimmed hat, and sunglasses. What should I know about osteoporosis? Osteoporosis is a condition in which bone destruction happens more quickly than new bone creation. After menopause, you may be at an increased risk for osteoporosis. To help prevent osteoporosis or the bone fractures that can happen because of osteoporosis, the following is recommended:  If you are 30-73 years old, get at least 1,000 mg of calcium and at least 600 mg of vitamin D per day.  If you are older than age 71 but younger than age 67, get at least 1,200 mg of calcium and at least 600 mg of vitamin D per day.  If you are older than age 35, get at least 1,200 mg of calcium and at least 800 mg of vitamin D per day. Smoking and excessive alcohol intake increase the risk of osteoporosis. Eat foods that are rich in calcium and vitamin D, and do weight-bearing exercises several times each week as directed by your health care provider. What should I know about how menopause affects my mental health? Depression may occur at  any age, but it is more common as you become older. Common symptoms of depression include:  Low or sad mood.  Changes in sleep patterns.  Changes in appetite or eating patterns.  Feeling an overall lack of motivation or enjoyment of activities that you previously enjoyed.  Frequent crying spells. Talk with your health care provider if you think that you are experiencing depression. What should I know about immunizations? It is important that you get and maintain your immunizations. These include:  Tetanus, diphtheria, and pertussis (Tdap) booster vaccine.  Influenza every year before the flu season begins.  Pneumonia vaccine.  Shingles vaccine. Your health care provider may also recommend other immunizations. This information is not intended to replace advice given to you by your health care provider. Make sure you discuss  any questions you have with your health care provider. Document Released: 11/12/2005 Document Revised: 04/09/2016 Document Reviewed: 06/24/2015 Elsevier Interactive Patient Education  2017 [employer].

## 2017-01-28 NOTE — Progress Notes (Signed)
Pre visit review using our clinic review tool, if applicable. No additional management support is needed unless otherwise documented below in the visit note. 

## 2017-01-28 NOTE — Progress Notes (Signed)
Subjective:    Patient ID: [name], female    DOB: [birth year], 72 y.o.   MRN: [id number]  HPI 71 year old patient who is seen today for a preventive health examination and Medicare wellness visit. 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 I recommend she follows with GI this year for repeat colonoscopy. She has no signs or symptoms to suggest cancer recurrence. There is no role for surveillance imaging study.  She is doing quite well today.  Risk and benefit of hormone replacement therapy again discussed.  She states when she stopped this medication.  She developed significant excessive perspiration.  It is quite bothersome, usually in the morning and also at night  Remains quite active with tennis 2-3 times per week  Past Medical History:  Diagnosis Date  . 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 Varicose veins      Social History   Social History  . Marital status: Married    Spouse name: N/A  . Number of children: 1  . Years of education: N/A   Occupational History  . coordinator    Social History Main Topics  . Smoking status: Never Smoker  . Smokeless tobacco: Never Used  . Alcohol use 0.0 oz/week  . Drug use: No  . Sexual activity: Not on file   Other Topics Concern  . Not on file   Social History Narrative  . No narrative on file    Past Surgical History:  Procedure Laterality Date  . ABDOMINAL HYSTERECTOMY    . APPENDECTOMY    . COLECTOMY      Family History  Problem Relation Age of Onset  . Thyroid disease Mother   . Arthritis Mother   . Cancer Father     lung ca , cad, smoker  . Heart disease Father     Allergies  Allergen Reactions  . Penicillins     Current Outpatient Prescriptions on File  Prior to Visit  Medication Sig Dispense Refill  . estradiol (CLIMARA - DOSED IN MG/24 HR) 0.025 mg/24hr patch PLACE 1 PATCH (0.025 MG TOTAL) ONTO THE SKIN ONCE A WEEK. 4 patch 10  . ibuprofen (ADVIL) 200 MG tablet Take 400 mg by mouth every 6 (six) hours as needed for pain. Reported on 12/22/2015     No current facility-administered medications on file prior to visit.     BP 138/72 (BP Location: Left Arm, Patient Position: Sitting, Cuff Size: Normal)   Pulse 62   Temp 97.7 F (36.5 C) (Oral)   Ht 5\' 5"  (1.651 m)   Wt 132 lb (59.9 kg)   SpO2 98%   BMI 21.97 kg/m   Medicare wellness visit  1. Risk factors, based on past  M,S,F history.  No significant cardiovascular risk factors  2.  Physical activities:remains quite active.  Tinnitus.  3 times per week  3.  Depression/mood:no history of major depression or mood disorder  4.  Hearing:no deficits  5.  ADL's:independent  6.  Fall risk:low 7.  Home safety:no problems identified  8.  Height weight, and visual acuity;height and weight stable.  Annual eye examination recommended  9.  Counseling:annual mammogram encouraged.  Patient will give herself another trial off hormone replacement therapy in the fall  10. Lab orders based on risk factors:laboratory  studies reviewed.  Will check TSH and lipid profile  11. Referral :mammogram  12. Care plan:continue efforts at aggressive risk factor modification.  Needs follow-up.  10 year colonoscopy  13. Cognitive assessment: alert and appropriate, normal affect no cognitive dysfunction 14. Screening: Patient provided with a written and personalized 5-10 year screening schedule in the AVS.    15. Provider List Update: oncology.  Primary care GI and ophthalmology   Review of Systems  Constitutional: Negative.   HENT: Negative for congestion, dental problem, hearing loss, rhinorrhea, sinus pressure, sore throat and tinnitus.   Eyes: Negative for pain, discharge and visual disturbance.    Respiratory: Negative for cough and shortness of breath.   Cardiovascular: Negative for chest pain, palpitations and leg swelling.  Gastrointestinal: Negative for abdominal distention, abdominal pain, blood in stool, constipation, diarrhea, nausea and vomiting.  Genitourinary: Negative for difficulty urinating, dysuria, flank pain, frequency, hematuria, pelvic pain, urgency, vaginal bleeding, vaginal discharge and vaginal pain.  Musculoskeletal: Negative for arthralgias, gait problem and joint swelling.  Skin: Negative for rash.  Neurological: Negative for dizziness, syncope, speech difficulty, weakness, numbness and headaches.  Hematological: Negative for adenopathy.  Psychiatric/Behavioral: Negative for agitation, behavioral problems and dysphoric mood. The patient is not nervous/anxious.        Objective:   Physical Exam  Constitutional: She is oriented to person, place, and time. She appears well-developed and well-nourished.  Repeat blood pressure 100/62  HENT:  Head: Normocephalic and atraumatic.  Right Ear: External ear normal.  Left Ear: External ear normal.  Mouth/Throat: Oropharynx is clear and moist.  Eyes: Conjunctivae and EOM are normal.  Neck: Normal range of motion. Neck supple. No JVD present. No thyromegaly present.  Cardiovascular: Normal rate, regular rhythm, normal heart sounds and intact distal pulses.   No murmur heard. Pulmonary/Chest: Effort normal and breath sounds normal. She has no wheezes. She has no rales.  Abdominal: Soft. Bowel sounds are normal. She exhibits no distension and no mass. There is no tenderness. There is no rebound and no guarding.  Musculoskeletal: Normal range of motion. She exhibits no edema or tenderness.  Neurological: She is alert and oriented to person, place, and time. She has normal reflexes. No cranial nerve deficit. She exhibits normal muscle tone. Coordination normal.  Skin: Skin is warm and dry. No rash noted.  Psychiatric: She  has a normal mood and affect. Her behavior is normal.          Assessment & Plan:   Preventive health examination Medicare wellness visit Postmenopausal Hormone replacement therapy.  We'll place on daily aspirin 81 mg.  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  Annual mammogram recommended.  Follow-up.  10 year colonoscopy recommended  Return here one year or as needed Check lipid profile and TSH  Sahir Tolson Pilar Plate

## 2017-02-07 ENCOUNTER — Encounter: Payer: Self-pay | Admitting: Internal Medicine

## 2017-03-01 ENCOUNTER — Encounter: Payer: Self-pay | Admitting: Internal Medicine

## 2017-03-01 ENCOUNTER — Other Ambulatory Visit: Payer: Self-pay | Admitting: Internal Medicine

## 2017-03-01 ENCOUNTER — Other Ambulatory Visit: Payer: Self-pay

## 2017-03-01 DIAGNOSIS — N63 Unspecified lump in unspecified breast: Secondary | ICD-10-CM

## 2017-03-14 ENCOUNTER — Telehealth: Payer: Self-pay | Admitting: Internal Medicine

## 2017-03-14 NOTE — Telephone Encounter (Signed)
Please see message below, please advise 

## 2017-03-14 NOTE — Telephone Encounter (Signed)
° °  Pt does not want to do a colonscopy because she has issues with the prep. Pt said she would like to do the cologuard.Would lie a call back

## 2017-03-14 NOTE — Telephone Encounter (Signed)
Okay for Cologuard testing

## 2017-03-14 NOTE — Telephone Encounter (Signed)
Left a message for pt to stop by the office to sign form.

## 2017-03-15 NOTE — Telephone Encounter (Signed)
Formed signed and faxed

## 2017-03-19 ENCOUNTER — Encounter: Payer: Self-pay | Admitting: Internal Medicine

## 2017-03-19 LAB — COLOGUARD

## 2017-03-21 ENCOUNTER — Ambulatory Visit
Admission: RE | Admit: 2017-03-21 | Discharge: 2017-03-21 | Disposition: A | Discharge status: Home / Self Care | Payer: PPO | Source: Ambulatory Visit | Attending: Internal Medicine | Admitting: Internal Medicine

## 2017-03-21 DIAGNOSIS — N63 Unspecified lump in unspecified breast: Secondary | ICD-10-CM

## 2017-03-21 DIAGNOSIS — R928 Other abnormal and inconclusive findings on diagnostic imaging of breast: Secondary | ICD-10-CM | POA: Diagnosis not present

## 2017-03-25 ENCOUNTER — Other Ambulatory Visit (INDEPENDENT_AMBULATORY_CARE_PROVIDER_SITE_OTHER): Payer: PPO

## 2017-03-25 DIAGNOSIS — R7989 Other specified abnormal findings of blood chemistry: Secondary | ICD-10-CM

## 2017-03-25 DIAGNOSIS — R946 Abnormal results of thyroid function studies: Secondary | ICD-10-CM | POA: Diagnosis not present

## 2017-03-25 LAB — TSH: TSH: 5.53 u[IU]/mL — ABNORMAL HIGH (ref 0.35–4.50)

## 2017-05-25 ENCOUNTER — Other Ambulatory Visit: Payer: Self-pay | Admitting: Internal Medicine

## 2017-05-26 DIAGNOSIS — Z1211 Encounter for screening for malignant neoplasm of colon: Secondary | ICD-10-CM | POA: Diagnosis not present

## 2017-05-26 DIAGNOSIS — Z1212 Encounter for screening for malignant neoplasm of rectum: Secondary | ICD-10-CM | POA: Diagnosis not present

## 2017-05-27 LAB — COLOGUARD: Cologuard: NEGATIVE

## 2017-06-08 ENCOUNTER — Encounter: Payer: Self-pay | Admitting: Internal Medicine

## 2017-10-13 NOTE — Progress Notes (Signed)
[name] [address] [place], [place] [address] Phone: [phone] Subjective:     CC: Left knee pain  [id number]  [name] is a 72 y.o. female coming in with complaint of left knee pain She has been in pain for one month. She had surgery 10 years ago on this knee. She has constant, achy pain. She notes more pain with a change in the weather. All of her pain in on the medial aspect of the knee. 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      Past Medical History:  Diagnosis Date  . 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 Varicose veins    Past Surgical History:  Procedure Laterality Date  . ABDOMINAL HYSTERECTOMY    . APPENDECTOMY    . COLECTOMY     Social History   Socioeconomic History  . Marital status: Married    Spouse name: None  . Number of children: 1  . Years of education: None  . Highest education level: None  Social Needs  . Financial resource strain: None  . Food insecurity - worry: None  . Food insecurity - inability: None  . Transportation needs - medical: None  . Transportation needs - non-medical: None  Occupational History  . Occupation: Warehouse manager  Tobacco Use  . Smoking status: Never Smoker  . Smokeless tobacco: Never Used  Substance and Sexual Activity  . Alcohol use: Yes    Alcohol/week: 0.0 oz  . Drug use: No  . Sexual activity: None  Other Topics Concern  . None  Social History Narrative  . None   Allergies  Allergen Reactions  . Penicillins    Family History  Problem Relation Age of Onset  . Thyroid disease Mother   . Arthritis Mother   . Cancer Father        lung ca , cad, smoker  . Heart disease Father      Past medical history, social, surgical and family history all reviewed in electronic medical record.  No pertanent information unless  stated regarding to the chief complaint.   Review of Systems:Review of systems updated and as accurate as of 10/14/17  No headache, visual changes, nausea, vomiting, diarrhea, constipation, dizziness, abdominal pain, skin rash, fevers, chills, night sweats, weight loss, swollen lymph nodes, body aches, joint swelling, muscle aches, chest pain, shortness of breath, mood changes.   Objective  Blood pressure 100/64, pulse 68, height 5' 5.5" (1.664 m), weight 133 lb (60.3 kg), SpO2 98 %. Systems examined below as of 10/14/17   General: No apparent distress alert and oriented x3 mood and affect normal, dressed appropriately.  HEENT: Pupils equal, extraocular movements intact  Respiratory: Patient's speak in full sentences and does not appear short of breath  Cardiovascular: No lower extremity edema, non tender, no erythema  Skin: Warm dry intact with no signs of infection or rash on extremities or on axial skeleton.  Abdomen: Soft nontender  Neuro: Cranial nerves II through XII are intact, neurovascularly intact in all extremities with 2+ DTRs and 2+ pulses.  Lymph: No lymphadenopathy of posterior or anterior cervical chain or axillae bilaterally.  Gait normal with good balance and coordination.  MSK:  Non tender with full range of motion and good stability and symmetric strength and tone of shoulders, elbows, wrist, hip, and ankles bilaterally.   Knee:  Left Normal to inspection with no erythema or effusion or obvious bony abnormalities. Palpation normal with no warmth, joint line tenderness, patellar tenderness, or condyle tenderness. ROM full in flexion and extension and lower leg rotation. Ligaments with solid consistent endpoints including ACL, PCL, LCL, MCL. Positive Mcmurray's, Apley's, and Thessalonian tests. Moderate painful patellar compression. Patellar glide mild crepitus. Patellar and quadriceps tendons unremarkable. Hamstring and quadriceps strength is normal. Lateral knee  unremarkable  MSK US performed of: Knee This study was ordered, performed, and interpreted by Charlann Boxer D.O.  Knee: All structures visualized. Patient does have moderate narrowing of the medial joint line.  Patient does have what appears to be acute on chronic meniscal tear with 25% of displacement as well as a peri-meniscal cyst noted.  Mild increase in Doppler flow. Very minimal arthritis of the patellofemoral joint.  Lateral aspect of the joint is unremarkable Patellar Tendon unremarkable on long and transverse views without effusion. No abnormality of prepatellar bursa. LCL and MCL unremarkable on long and transverse views. No abnormality of origin of medial or lateral head of the gastrocnemius.  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      Note: This dictation was prepared with Dragon dictation along with smaller phrase technology. Any transcriptional errors that result from this process are unintentional.

## 2017-10-14 ENCOUNTER — Encounter: Payer: Self-pay | Admitting: Family Medicine

## 2017-10-14 ENCOUNTER — Ambulatory Visit: Payer: Self-pay

## 2017-10-14 ENCOUNTER — Ambulatory Visit (INDEPENDENT_AMBULATORY_CARE_PROVIDER_SITE_OTHER): Payer: PPO | Admitting: Family Medicine

## 2017-10-14 VITALS — BP 100/64 | HR 68 | Ht 65.5 in | Wt 133.0 lb

## 2017-10-14 DIAGNOSIS — G8929 Other chronic pain: Secondary | ICD-10-CM | POA: Diagnosis not present

## 2017-10-14 DIAGNOSIS — S83242A Other tear of medial meniscus, current injury, left knee, initial encounter: Secondary | ICD-10-CM | POA: Diagnosis not present

## 2017-10-14 DIAGNOSIS — M25562 Pain in left knee: Principal | ICD-10-CM

## 2017-10-14 DIAGNOSIS — G890 Central pain syndrome: Secondary | ICD-10-CM | POA: Diagnosis not present

## 2017-10-14 NOTE — Assessment & Plan Note (Signed)
Acute on chronic tear with peri-meniscal cyst.  Patient given brace, avoid twisting motion, home exercises given, topical anti-inflammatories given, icing regimen.  Follow-up again in 4 weeks

## 2017-10-14 NOTE — Patient Instructions (Signed)
Good to see you  Ice 20 minutes 2 times daily. Usually after activity and before bed. Exercises 3 times a week.  pennsaid pinkie amount topically 2 times daily as needed.   Vitamin D 2000 IU daily  Meniscal tear, arthritis and peri-meniscal cyst  Be careful with twisting Wear brace with a lot of activity  See me again in 4 weeks

## 2017-11-11 ENCOUNTER — Encounter: Payer: Self-pay | Admitting: Family Medicine

## 2017-11-11 ENCOUNTER — Ambulatory Visit (INDEPENDENT_AMBULATORY_CARE_PROVIDER_SITE_OTHER): Payer: PPO | Admitting: Family Medicine

## 2017-11-11 DIAGNOSIS — S83242A Other tear of medial meniscus, current injury, left knee, initial encounter: Secondary | ICD-10-CM

## 2017-11-11 NOTE — Patient Instructions (Signed)
Good to see you  I am impressed  Brace still with tennis  Ice at night  If it enlarges then call us at [phone] and we will consider injection  See me again in 6 weeks if not perfect

## 2017-11-11 NOTE — Assessment & Plan Note (Signed)
Doing well with conservative therapy.  Patient will continue to wear the brace with any type of cutting sports.  Encourage patient to continue the exercises 2-3 times a week.  Worsening symptoms we will consider injection and aspiration.  Patient is in agreement with the plan

## 2017-11-11 NOTE — Progress Notes (Signed)
[name] [address] [place], [place] [address] Phone: [phone] Subjective:       CC: Left knee follow-up  [id number]  [name] is a 72 y.o. female coming in with complaint of left knee pain.  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  Patient encouraged to try the home exercises, icing regimen, topical anti-inflammatories and avoid certain activities.  Patient states that she still has pain with standing for prolonged periods. She does state that she feels better overall. She is wearing the brace for physical activity which has helped.      Past Medical History:  Diagnosis Date  . 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 Varicose veins    Past Surgical History:  Procedure Laterality Date  . ABDOMINAL HYSTERECTOMY    . APPENDECTOMY    . COLECTOMY     Social History   Socioeconomic History  . Marital status: Married    Spouse name: None  . Number of children: 1  . Years of education: None  . Highest education level: None  Social Needs  . Financial resource strain: None  . Food insecurity - worry: None  . Food insecurity - inability: None  . Transportation needs - medical: None  . Transportation needs - non-medical: None  Occupational History  . Occupation: Warehouse manager  Tobacco Use  . Smoking status: Never Smoker  . Smokeless tobacco: Never Used  Substance and Sexual Activity  . Alcohol use: Yes    Alcohol/week: 0.0 oz  . Drug use: No  . Sexual activity: None  Other Topics Concern  . None  Social History Narrative  . None   Allergies  Allergen Reactions  . Penicillins    Family History  Problem Relation Age of Onset  . Thyroid disease Mother   . Arthritis Mother   . Cancer Father        lung ca , cad, smoker  . Heart disease Father      Past medical history,  social, surgical and family history all reviewed in electronic medical record.  No pertanent information unless stated regarding to the chief complaint.   Review of Systems:Review of systems updated and as accurate as of 11/11/17  No headache, visual changes, nausea, vomiting, diarrhea, constipation, dizziness, abdominal pain, skin rash, fevers, chills, night sweats, weight loss, swollen lymph nodes, body aches, joint swelling,  chest pain, shortness of breath, mood changes.  Positive muscle aches  Objective  Blood pressure 96/60, pulse 70, height 5\' 5"  (1.651 m), weight 133 lb (60.3 kg), SpO2 98 %. Systems examined below as of 11/11/17   General: No apparent distress alert and oriented x3 mood and affect normal, dressed appropriately.  HEENT: Pupils equal, extraocular movements intact  Respiratory: Patient's speak in full sentences and does not appear short of breath  Cardiovascular: No lower extremity edema, non tender, no erythema  Skin: Warm dry intact with no signs of infection or rash on extremities or on axial skeleton.  Abdomen: Soft nontender  Neuro: Cranial nerves II through XII are intact, neurovascularly intact in all extremities with 2+ DTRs and 2+ pulses.  Lymph: No lymphadenopathy of posterior or anterior cervical chain or axillae bilaterally.  Gait normal with good balance and coordination.  MSK:  Non tender with full range of motion and good stability and symmetric strength and tone of shoulders, elbows,  wrist, hip, and ankles bilaterally.  Knee: Left Normal to inspection with no erythema or effusion or obvious bony abnormalities. Palpation normal with no warmth, joint line tenderness, patellar tenderness, or condyle tenderness. ROM full in flexion and extension and lower leg rotation. Ligaments with solid consistent endpoints including ACL, PCL, LCL, MCL. Mild positive Mcmurray's, Apley's, and Thessalonian tests. Non painful patellar compression. Patellar glide mild  crepitus. Patellar and quadriceps tendons unremarkable. Hamstring and quadriceps strength is normal.    Impression and Recommendations:     This case required medical decision making of moderate complexity.      Note: This dictation was prepared with Dragon dictation along with smaller phrase technology. Any transcriptional errors that result from this process are unintentional.

## 2017-12-14 ENCOUNTER — Ambulatory Visit: Payer: Self-pay

## 2017-12-14 ENCOUNTER — Ambulatory Visit (INDEPENDENT_AMBULATORY_CARE_PROVIDER_SITE_OTHER)
Admission: RE | Admit: 2017-12-14 | Discharge: 2017-12-14 | Disposition: A | Discharge status: Home / Self Care | Payer: PPO | Source: Ambulatory Visit | Attending: Family Medicine | Admitting: Family Medicine

## 2017-12-14 ENCOUNTER — Encounter: Payer: Self-pay | Admitting: Family Medicine

## 2017-12-14 ENCOUNTER — Ambulatory Visit: Payer: PPO | Admitting: Family Medicine

## 2017-12-14 VITALS — BP 110/74 | HR 60 | Ht 65.0 in | Wt 137.0 lb

## 2017-12-14 DIAGNOSIS — M25562 Pain in left knee: Secondary | ICD-10-CM

## 2017-12-14 DIAGNOSIS — G8929 Other chronic pain: Secondary | ICD-10-CM

## 2017-12-14 DIAGNOSIS — S83242D Other tear of medial meniscus, current injury, left knee, subsequent encounter: Secondary | ICD-10-CM | POA: Diagnosis not present

## 2017-12-14 DIAGNOSIS — M7989 Other specified soft tissue disorders: Secondary | ICD-10-CM | POA: Diagnosis not present

## 2017-12-14 DIAGNOSIS — S83242A Other tear of medial meniscus, current injury, left knee, initial encounter: Secondary | ICD-10-CM

## 2017-12-14 NOTE — Assessment & Plan Note (Signed)
Patient given injection today.  Tolerated the procedure well.  We discussed icing regimen and home exercises.  Which activities to doing which wants to avoid.  Discussed that likely will have some bruising noted.  X-rays ordered today to further evaluate for any other pathology that could be contributing.  MRI may be necessary if continuing to have trouble.  Follow-up again in 3 weeks

## 2017-12-14 NOTE — Progress Notes (Signed)
[name] [address] [place], [place] [address] Phone: [phone] Subjective:    I'm seeing this patient by the request  of:    CC: Left knee follow-up  [id number]  [name] is a 72 y.o. female coming in with complaint of left knee pain.  Was doing fairly well with conservative therapy will for a meniscal cyst and moderate arthritis.  States that today he had severe amount of pain.  Unable to walk.  Stable on the medial aspect of the left knee.  Feels like she is having decreasing range of motion secondary to pain.      Past Medical History:  Diagnosis Date  . 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 Varicose veins    Past Surgical History:  Procedure Laterality Date  . ABDOMINAL HYSTERECTOMY    . APPENDECTOMY    . COLECTOMY     Social History   Socioeconomic History  . Marital status: Married    Spouse name: None  . Number of children: 1  . Years of education: None  . Highest education level: None  Social Needs  . Financial resource strain: None  . Food insecurity - worry: None  . Food insecurity - inability: None  . Transportation needs - medical: None  . Transportation needs - non-medical: None  Occupational History  . Occupation: Warehouse manager  Tobacco Use  . Smoking status: Never Smoker  . Smokeless tobacco: Never Used  Substance and Sexual Activity  . Alcohol use: Yes    Alcohol/week: 0.0 oz  . Drug use: No  . Sexual activity: None  Other Topics Concern  . None  Social History Narrative  . None   Allergies  Allergen Reactions  . Penicillins    Family History  Problem Relation Age of Onset  . Thyroid disease Mother   . Arthritis Mother   . Cancer Father        lung ca , cad, smoker  . Heart disease Father      Past medical history, social, surgical and family history all reviewed in  electronic medical record.  No pertanent information unless stated regarding to the chief complaint.   Review of Systems:Review of systems updated and as accurate as of 12/14/17  No headache, visual changes, nausea, vomiting, diarrhea, constipation, dizziness, abdominal pain, skin rash, fevers, chills, night sweats, weight loss, swollen lymph nodes, body aches,chest pain, shortness of breath, mood changes.  Positive joint swelling and muscle aches  Objective  Blood pressure 110/74, pulse 60, height 5\' 5"  (1.651 m), weight 137 lb (62.1 kg), SpO2 98 %. Systems examined below as of 12/14/17   General: No apparent distress alert and oriented x3 mood and affect normal, dressed appropriately.  HEENT: Pupils equal, extraocular movements intact  Respiratory: Patient's speak in full sentences and does not appear short of breath  Cardiovascular: No lower extremity edema, non tender, no erythema  Skin: Warm dry intact with no signs of infection or rash on extremities or on axial skeleton.  Abdomen: Soft nontender  Neuro: Cranial nerves II through XII are intact, neurovascularly intact in all extremities with 2+ DTRs and 2+ pulses.  Lymph: No lymphadenopathy of posterior or anterior cervical chain or axillae bilaterally.  Gait normal with good balance and coordination.  MSK:  Non tender with full range of motion and good stability and symmetric strength and tone of shoulders, elbows,  wrist, hip and ankles bilaterally.  Knee: Left Normal to inspection with no erythema or effusion or obvious bony abnormalities. Tender over the medial joint line ROM lacks last 5 degrees of extension and lacks 5 degrees of flexion Ligaments with solid consistent endpoints including ACL, PCL, LCL, MCL. Negative Mcmurray's, Apley's, and Thessalonian tests. Non painful patellar compression. Patellar glide with mild to moderate crepitus. Patellar and quadriceps tendons unremarkable. Hamstring and quadriceps strength is  normal. Contralateral knee unremarkable  Procedure: Real-time Ultrasound Guided Injection of left knee.  Meniscal cyst Device: GE Logiq Q7 Ultrasound guided injection is preferred based studies that show increased duration, increased effect, greater accuracy, decreased procedural pain, increased response rate, and decreased cost with ultrasound guided versus blind injection.  Verbal informed consent obtained.  Time-out conducted.  Noted no overlying erythema, induration, or other signs of local infection.  Skin prepped in a sterile fashion.  Local anesthesia: Topical Ethyl chloride.  With sterile technique and under real time ultrasound guidance: With a 22-gauge 2 inch needle patient was injected with 4 cc of 0.5% Marcaine and aspirated 15 cc of strawlike color fluid from the para meniscal cyst but injected 1 cc of Kenalog 40 mg/mL Completed without difficulty  Pain immediately resolved suggesting accurate placement of the medication.  Advised to call if fevers/chills, erythema, induration, drainage, or persistent bleeding.  Images permanently stored and available for review in the ultrasound unit.  Impression: Technically successful ultrasound guided injection.    Impression and Recommendations:     This case required medical decision making of moderate complexity.      Note: This dictation was prepared with Dragon dictation along with smaller phrase technology. Any transcriptional errors that result from this process are unintentional.

## 2017-12-14 NOTE — Patient Instructions (Signed)
Good to see you  Drained the cyst today  I hope it helps  Take a couple days easy  Then ok to increase activity  See me again in 3 weeks just in case.

## 2017-12-26 ENCOUNTER — Ambulatory Visit: Payer: PPO | Admitting: Family Medicine

## 2018-01-05 ENCOUNTER — Ambulatory Visit: Payer: PPO | Admitting: Family Medicine

## 2018-02-16 DIAGNOSIS — L728 Other follicular cysts of the skin and subcutaneous tissue: Secondary | ICD-10-CM | POA: Diagnosis not present

## 2018-02-16 DIAGNOSIS — D229 Melanocytic nevi, unspecified: Secondary | ICD-10-CM | POA: Diagnosis not present

## 2018-02-16 DIAGNOSIS — L820 Inflamed seborrheic keratosis: Secondary | ICD-10-CM | POA: Diagnosis not present

## 2018-02-28 ENCOUNTER — Ambulatory Visit: Payer: PPO | Admitting: Family Medicine

## 2018-03-10 ENCOUNTER — Encounter: Payer: Self-pay | Admitting: Adult Health

## 2018-03-10 ENCOUNTER — Ambulatory Visit (INDEPENDENT_AMBULATORY_CARE_PROVIDER_SITE_OTHER): Payer: PPO | Admitting: Adult Health

## 2018-03-10 VITALS — BP 108/64 | Temp 97.7°F | Wt 129.0 lb

## 2018-03-10 DIAGNOSIS — L237 Allergic contact dermatitis due to plants, except food: Secondary | ICD-10-CM | POA: Diagnosis not present

## 2018-03-10 MED ORDER — PREDNISONE 10 MG PO TABS: 10.0000 mg | ORAL_TABLET | Freq: Every day | ORAL | 0 refills | Status: DC

## 2018-03-10 MED ORDER — METHYLPREDNISOLONE ACETATE 80 MG/ML IJ SUSP
80.0000 mg | Freq: Once | INTRAMUSCULAR | Status: AC
  Administered 2018-03-10: 80 mg via INTRAMUSCULAR

## 2018-03-10 NOTE — Progress Notes (Signed)
Subjective:    Patient ID: [name], female    DOB: November 27, 1945, 72 y.o.   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  She is a patient of Dr.K who I am seeing today for an acute issue of poison ivy exposure. She was working out in her garden and came in contact with poison ivy. She was exposed one week ago. Reports intense itching on bilateral arms, red rash and drainage from wounds.   Denies fevers, shortness of breath, or chest pain. She has not noticed any signs of infection.   Review of Systems See HPI   Past Medical History:  Diagnosis Date  . 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 [place] MALIG NEOPLASM RETROPERITONEUM&PERITONEUM 06/09/2010  . Varicose veins     Social History   Socioeconomic History  . Marital status: Married    Spouse name: Not on file  . Number of children: 1  . Years of education: Not on file  . Highest education level: Not on file  Occupational History  . Occupation: Warehouse manager  Social Needs  . Financial resource strain: Not on file  . Food insecurity:    Worry: Not on file    Inability: Not on file  . Transportation needs:    Medical: Not on file    Non-medical: Not on file  Tobacco Use  . Smoking status: Never Smoker  . Smokeless tobacco: Never Used  Substance and Sexual Activity  . Alcohol use: Yes    Alcohol/week: 0.0 oz  . Drug use: No  . Sexual activity: Not on file  Lifestyle  . Physical activity:    Days per week: Not on file    Minutes per session: Not on file  . Stress: Not on file  Relationships  . Social connections:    Talks on phone: Not on file    Gets together: Not on file    Attends religious service: Not on file    Active member of club or organization: Not on file    Attends meetings of clubs or  organizations: Not on file    Relationship status: Not on file  . Intimate partner violence:    Fear of current or ex partner: Not on file    Emotionally abused: Not on file    Physically abused: Not on file    Forced sexual activity: Not on file  Other Topics Concern  . Not on file  Social History Narrative  . Not on file    Past Surgical History:  Procedure Laterality Date  . ABDOMINAL HYSTERECTOMY    . APPENDECTOMY    . COLECTOMY      Family History  Problem Relation Age of Onset  . Thyroid disease Mother   . Arthritis Mother   . Cancer Father        lung ca , cad, smoker  . Heart disease Father     Allergies  Allergen Reactions  . Penicillins     Current Outpatient Medications on File Prior to Visit  Medication Sig Dispense Refill  . aspirin 81 MG tablet Take 1 tablet (81 mg total) by mouth daily. 30 tablet   . cholecalciferol (VITAMIN D) 1000 units tablet Take 2,000 Units by mouth daily.    Marland Kitchen ibuprofen (ADVIL) 200 MG tablet Take 400 mg by mouth  every 6 (six) hours as needed for pain. Reported on 12/22/2015     No current facility-administered medications on file prior to visit.     BP 108/64   Temp 97.7 F (36.5 C) (Oral)   Wt 129 lb (58.5 kg)   BMI 21.47 kg/m       Objective:   Physical Exam  Constitutional: She is oriented to person, place, and time. She appears well-developed and well-nourished. No distress.  Cardiovascular: Normal rate, regular rhythm, normal heart sounds and intact distal pulses. Exam reveals no gallop and no friction rub.  No murmur heard. Pulmonary/Chest: Effort normal and breath sounds normal. No stridor. No respiratory distress. She has no wheezes. She has no rales. She exhibits no tenderness.  Neurological: She is alert and oriented to person, place, and time.  Skin: Skin is warm and dry. Capillary refill takes less than 2 seconds. Rash noted. She is not diaphoretic.  Red, vascular appearing rash on bilateral forearms R>L.     Psychiatric: She has a normal mood and affect. Her behavior is normal. Judgment and thought content normal.  Nursing note and vitals reviewed.     Assessment & Plan:  1. Poison ivy dermatitis - methylPREDNISolone acetate (DEPO-MEDROL) injection 80 mg - predniSONE (DELTASONE) 10 MG tablet; Take 1 tablet (10 mg total) by mouth daily with breakfast.  Dispense: 7 tablet; Refill: 0 - Advised oatmeal baths, anti itch cream and/o benadryl at night  - Follow up if not resolved   Dorothyann Peng, NP

## 2018-03-22 ENCOUNTER — Encounter: Payer: PPO | Admitting: Family Medicine

## 2018-03-22 DIAGNOSIS — Z0289 Encounter for other administrative examinations: Secondary | ICD-10-CM

## 2018-03-30 DIAGNOSIS — L820 Inflamed seborrheic keratosis: Secondary | ICD-10-CM | POA: Diagnosis not present

## 2018-03-30 DIAGNOSIS — H00013 Hordeolum externum right eye, unspecified eyelid: Secondary | ICD-10-CM | POA: Diagnosis not present

## 2018-04-04 ENCOUNTER — Encounter: Payer: Self-pay | Admitting: Family Medicine

## 2018-04-04 ENCOUNTER — Ambulatory Visit (INDEPENDENT_AMBULATORY_CARE_PROVIDER_SITE_OTHER): Payer: PPO | Admitting: Family Medicine

## 2018-04-04 VITALS — BP 120/76 | HR 61 | Temp 97.6°F | Resp 12 | Ht 65.0 in | Wt 131.1 lb

## 2018-04-04 DIAGNOSIS — E039 Hypothyroidism, unspecified: Secondary | ICD-10-CM

## 2018-04-04 DIAGNOSIS — E7800 Pure hypercholesterolemia, unspecified: Secondary | ICD-10-CM

## 2018-04-04 DIAGNOSIS — R232 Flushing: Secondary | ICD-10-CM | POA: Diagnosis not present

## 2018-04-04 DIAGNOSIS — E038 Other specified hypothyroidism: Secondary | ICD-10-CM

## 2018-04-04 MED ORDER — VENLAFAXINE HCL ER 37.5 MG PO CP24: 37.5000 mg | ORAL_CAPSULE | Freq: Every day | ORAL | 1 refills | Status: DC

## 2018-04-04 NOTE — Patient Instructions (Addendum)
A few things to remember from today's visit:   Hypercholesterolemia - Plan: Lipid panel  Subclinical hypothyroidism - Plan: TSH  Hot flash not due to menopause - Plan: venlafaxine XR (EFFEXOR XR) 37.5 MG 24 hr capsule  Labs tomorrow.  Please let me know in 6 weeks how you are doing with Effexor.   Please be sure medication list is accurate. If a new problem present, please set up appointment sooner than planned today.

## 2018-04-04 NOTE — Progress Notes (Signed)
HPI:   [name] is a 72 y.o. female, who is here today to establish care.  Former PCP: Dr Burnice Logan Last preventive routine visit: 01/2017.  She lives with her husband, who suffers from Alzheimer's disease.  Chronic medical problems: Subclinical hypothyroidism and mild hypercholesterolemia.  No history of hypertension or diabetes.  Hypothyroidism:  Currently she is not on thyroid hormone replacement. She has not noted dysphagia, palpitations, abdominal pain, changes in bowel habits, tremor, cold/heat intolerance, or abnormal weight loss.  Lab Results  Component Value Date   TSH 5.53 (H) 03/25/2017      Concerns today:   Stye in lower eyelid left eye for a few days, it is getting better. She has been applying warm compresses. She has not noted conjunctival erythema or drainage.   Hot flashes: Until 1 to 2 years ago she was on hormonal therapy. A few months ago she started again with hot flashes, mainly in the morning. She has not identified exacerbating or alleviating factors.   She has not try OTC medications.   She is due for mammogram, she already received reminding letter and is planning on scheduling.    Hyperlipidemia:  Currently on nonpharmacologic treatment. Following a low fat diet: Yes. She exercises regularly, she plays tennis twice per week and walks her dog daily.   Lab Results  Component Value Date   CHOL 206 (H) 01/28/2017   HDL 70.10 01/28/2017   LDLCALC 121 (H) 01/28/2017   LDLDIRECT 114.5 11/14/2012   TRIG 78.0 01/28/2017   CHOLHDL 3 01/28/2017   She is on Aspirin 81 mg daily. Denies history of CVD.   Review of Systems  Constitutional: Negative for activity change, appetite change, fatigue and fever.  HENT: Negative for mouth sores, nosebleeds and trouble swallowing.   Eyes: Negative for redness and visual disturbance.  Respiratory: Negative for cough, shortness of breath and wheezing.   Cardiovascular:  Negative for chest pain, palpitations and leg swelling.  Gastrointestinal: Negative for abdominal pain, nausea and vomiting.       Negative for changes in bowel habits.  Endocrine: Negative for cold intolerance and heat intolerance.  Genitourinary: Negative for decreased urine volume, hematuria, vaginal bleeding and vaginal discharge.  Musculoskeletal: Negative for gait problem and myalgias.  Skin: Negative for pallor and rash.  Neurological: Negative for syncope, weakness and headaches.  Psychiatric/Behavioral: Positive for sleep disturbance. Negative for confusion. The patient is not nervous/anxious.       Current Outpatient Medications on File Prior to Visit  Medication Sig Dispense Refill  . aspirin 81 MG tablet Take 1 tablet (81 mg total) by mouth daily. 30 tablet   . cholecalciferol (VITAMIN D) 1000 units tablet Take 2,000 Units by mouth daily.    Marland Kitchen ibuprofen (ADVIL) 200 MG tablet Take 400 mg by mouth every 6 (six) hours as needed for pain. Reported on 12/22/2015    . tretinoin (RETIN-A) 0.05 % cream APPLY TO FACE AT BEDTIME AS DIRECTED  3   No current facility-administered medications on file prior to visit.      Past Medical History:  Diagnosis Date  . 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 [place] MALIG NEOPLASM RETROPERITONEUM&PERITONEUM 06/09/2010  . Varicose veins    Allergies  Allergen Reactions  . Penicillins     Family History  Problem Relation Age of Onset  . Thyroid disease Mother   . Arthritis Mother   . Cancer Father  lung ca , cad, smoker  . Heart disease Father     Social History   Socioeconomic History  . Marital status: Married    Spouse name: Not on file  . Number of children: 1  . Years of education: Not on file  . Highest education level: Not on file  Occupational History  . Occupation: Warehouse manager  Social Needs  . Financial resource strain: Not on file  . Food insecurity:    Worry:  Not on file    Inability: Not on file  . Transportation needs:    Medical: Not on file    Non-medical: Not on file  Tobacco Use  . Smoking status: Never Smoker  . Smokeless tobacco: Never Used  Substance and Sexual Activity  . Alcohol use: Yes    Alcohol/week: 0.0 oz  . Drug use: No  . Sexual activity: Not on file  Lifestyle  . Physical activity:    Days per week: Not on file    Minutes per session: Not on file  . Stress: Not on file  Relationships  . Social connections:    Talks on phone: Not on file    Gets together: Not on file    Attends religious service: Not on file    Active member of club or organization: Not on file    Attends meetings of clubs or organizations: Not on file    Relationship status: Not on file  Other Topics Concern  . Not on file  Social History Narrative  . Not on file    Vitals:   04/04/18 1528  BP: 120/76  Pulse: 61  Resp: 12  Temp: 97.6 F (36.4 C)  SpO2: 99%    Body mass index is 21.82 kg/m.    Physical Exam  Nursing note and vitals reviewed. Constitutional: She is oriented to person, place, and time. She appears well-developed and well-nourished. No distress.  HENT:  Head: Normocephalic and atraumatic.  Mouth/Throat: Oropharynx is clear and moist and mucous membranes are normal.  Eyes: Pupils are equal, round, and reactive to light. Conjunctivae and EOM are normal.    About 1 to 2 mm, erythematosus papular lesion on lower eyelid left eye.   Neck: No tracheal deviation present. No thyroid mass and no thyromegaly present.  Cardiovascular: Normal rate and regular rhythm.  No murmur heard. Pulses:      Dorsalis pedis pulses are 2+ on the right side, and 2+ on the left side.  Respiratory: Effort normal and breath sounds normal. No respiratory distress.  GI: Soft. She exhibits no mass. There is no hepatomegaly. There is no tenderness.  Musculoskeletal: She exhibits no edema.  Lymphadenopathy:    She has no cervical  adenopathy.  Neurological: She is alert and oriented to person, place, and time. She has normal strength. Gait normal.  Skin: Skin is warm. No erythema.  Psychiatric: She has a normal mood and affect.  Well groomed, good eye contact.      ASSESSMENT AND PLAN:   [name] was seen today for transfer of care.  Diagnoses and all orders for this visit:  Lab Results  Component Value Date   CHOL 212 (H) 04/05/2018   HDL 75.80 04/05/2018   LDLCALC 119 (H) 04/05/2018   LDLDIRECT 114.5 11/14/2012   TRIG 84.0 04/05/2018   CHOLHDL 3 04/05/2018   Lab Results  Component Value Date   TSH 2.78 04/05/2018    Hypercholesterolemia  She will continue nonpharmacologic treatment, healthy diet and regular physical activity.  She will come back in a few days for fasting labs. Further recommendations will be given according to lipid panel results as well as 10-year CVD risk.  The 10-year ASCVD risk score Mikey Bussing DC Brooke Bonito., et al., 2013) is: 8%   Values used to calculate the score:     Age: 22 years     Sex: Female     Is Non-Hispanic African American: No     Diabetic: No     Tobacco smoker: No     Systolic Blood Pressure: 701 mmHg     Is BP treated: No     HDL Cholesterol: 75.8 mg/dL     Total Cholesterol: 212 mg/dL   -     Lipid panel; Future  Subclinical hypothyroidism  Further recommendations will be given according to TSH results. She is not interested in thyroid hormone therapy, so as far as TSH is less than 10 and asymptomatic, we can continue monitoring annually.  -     TSH; Future   Hot flash not due to menopause  We discussed known pharmacologic treatment options. She agrees with trying Effexor XR 37.5 mg at bedtime. We discussed some side effects. She will let me know how she is doing with new medication in about 6 weeks and if it is helping with hot flashes, if not helping we can consider paroxetine 7.5-10 mg.  -     venlafaxine XR (EFFEXOR XR) 37.5 MG 24 hr capsule;  Take 1 capsule (37.5 mg total) by mouth daily with breakfast.       Betty G. Martinique, MD  [facility]. [place] office.

## 2018-04-05 LAB — TSH: TSH: 2.78 u[IU]/mL (ref 0.35–4.50)

## 2018-04-05 LAB — LIPID PANEL
CHOL/HDL RATIO: 3
CHOLESTEROL: 212 mg/dL — AB (ref 0–200)
HDL: 75.8 mg/dL (ref >39.00)
LDL Cholesterol: 119 mg/dL — ABNORMAL HIGH (ref 0–99)
NONHDL: 135.96
TRIGLYCERIDES: 84 mg/dL (ref 0.0–149.0)
VLDL: 16.8 mg/dL (ref 0.0–40.0)

## 2018-04-10 ENCOUNTER — Other Ambulatory Visit: Payer: Self-pay | Admitting: *Deleted

## 2018-04-10 MED ORDER — SIMVASTATIN 20 MG PO TABS: 20.0000 mg | ORAL_TABLET | Freq: Every day | ORAL | 3 refills | Status: DC

## 2018-04-14 ENCOUNTER — Telehealth: Payer: Self-pay | Admitting: Family Medicine

## 2018-04-14 NOTE — Telephone Encounter (Signed)
Copied from [place] [id number]. Topic: Quick Communication - See Telephone Encounter >> Apr 14, 2018  8:27 AM Robina Ade, Helene Kelp D wrote: CRM for notification. See Telephone encounter for: 04/14/18. Patient called upset and said that she got a no show bill for 03/22/18 and she wants to talk to someone about this. She said this is her 1st time to have done that and that she called and explain the reason due to an emergency but still got charged for it. Please call patient back, thanks.

## 2018-04-14 NOTE — Telephone Encounter (Signed)
Will forward to [facility] to address on Monday Pt aware via voicemail that we will be in touch first of next week

## 2018-04-17 NOTE — Telephone Encounter (Signed)
Email sent to charge correction to have fee removed as one-time courtesy. Pt advised. Nothing further needed.

## 2018-05-15 ENCOUNTER — Other Ambulatory Visit: Payer: Self-pay | Admitting: Family Medicine

## 2018-05-15 DIAGNOSIS — Z1231 Encounter for screening mammogram for malignant neoplasm of breast: Secondary | ICD-10-CM

## 2018-06-07 ENCOUNTER — Ambulatory Visit: Payer: PPO | Admitting: Family Medicine

## 2018-06-07 NOTE — Progress Notes (Deleted)
[name] [address], [place] [address] Phone: [phone] Subjective:    I'm seeing this patient by the request  of:    CC:   [id number]  [name] is a 72 y.o. female coming in with complaint of ***  Onset-  Location Duration-  Character- Aggravating factors- Reliving factors-  Therapies tried-  Severity-     Past Medical History:  Diagnosis Date  . 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 [place] MALIG NEOPLASM RETROPERITONEUM&PERITONEUM 06/09/2010  . Varicose veins    Past Surgical History:  Procedure Laterality Date  . ABDOMINAL HYSTERECTOMY    . APPENDECTOMY    . COLECTOMY     Social History   Socioeconomic History  . Marital status: Married    Spouse name: Not on file  . Number of children: 1  . Years of education: Not on file  . Highest education level: Not on file  Occupational History  . Occupation: Warehouse manager  Social Needs  . Financial resource strain: Not on file  . Food insecurity:    Worry: Not on file    Inability: Not on file  . Transportation needs:    Medical: Not on file    Non-medical: Not on file  Tobacco Use  . Smoking status: Never Smoker  . Smokeless tobacco: Never Used  Substance and Sexual Activity  . Alcohol use: Yes    Alcohol/week: 0.0 standard drinks  . Drug use: No  . Sexual activity: Not on file  Lifestyle  . Physical activity:    Days per week: Not on file    Minutes per session: Not on file  . Stress: Not on file  Relationships  . Social connections:    Talks on phone: Not on file    Gets together: Not on file    Attends religious service: Not on file    Active member of club or organization: Not on file    Attends meetings of clubs or organizations: Not on file    Relationship status: Not on file  Other Topics Concern  . Not on file  Social History Narrative  . Not on file   Allergies  Allergen  Reactions  . Penicillins    Family History  Problem Relation Age of Onset  . Thyroid disease Mother   . Arthritis Mother   . Cancer Father        lung ca , cad, smoker  . Heart disease Father      Current Outpatient Medications (Cardiovascular):  .  simvastatin (ZOCOR) 20 MG tablet, Take 1 tablet (20 mg total) by mouth at bedtime.   Current Outpatient Medications (Analgesics):  .  aspirin 81 MG tablet, Take 1 tablet (81 mg total) by mouth daily. Marland Kitchen  ibuprofen (ADVIL) 200 MG tablet, Take 400 mg by mouth every 6 (six) hours as needed for pain. Reported on 12/22/2015   Current Outpatient Medications (Other):  .  cholecalciferol (VITAMIN D) 1000 units tablet, Take 2,000 Units by mouth daily. Marland Kitchen  tretinoin (RETIN-A) 0.05 % cream, APPLY TO FACE AT BEDTIME AS DIRECTED .  venlafaxine XR (EFFEXOR XR) 37.5 MG 24 hr capsule, Take 1 capsule (37.5 mg total) by mouth daily with breakfast.    Past medical history, social, surgical and family history all reviewed in electronic medical record.  No pertanent information unless stated regarding to the chief complaint.   Review of Systems:  No headache,  visual changes, nausea, vomiting, diarrhea, constipation, dizziness, abdominal pain, skin rash, fevers, chills, night sweats, weight loss, swollen lymph nodes, body aches, joint swelling, muscle aches, chest pain, shortness of breath, mood changes.   Objective  There were no vitals taken for this visit. Systems examined below as of    General: No apparent distress alert and oriented x3 mood and affect normal, dressed appropriately.  HEENT: Pupils equal, extraocular movements intact  Respiratory: Patient's speak in full sentences and does not appear short of breath  Cardiovascular: No lower extremity edema, non tender, no erythema  Skin: Warm dry intact with no signs of infection or rash on extremities or on axial skeleton.  Abdomen: Soft nontender  Neuro: Cranial nerves II through XII are intact,  neurovascularly intact in all extremities with 2+ DTRs and 2+ pulses.  Lymph: No lymphadenopathy of posterior or anterior cervical chain or axillae bilaterally.  Gait normal with good balance and coordination.  MSK:  Non tender with full range of motion and good stability and symmetric strength and tone of shoulders, elbows, wrist, hip, knee and ankles bilaterally.     Impression and Recommendations:     This case required medical decision making of moderate complexity. The above documentation has been reviewed and is accurate and complete Lyndal Pulley, DO       Note: This dictation was prepared with Dragon dictation along with smaller phrase technology. Any transcriptional errors that result from this process are unintentional.

## 2018-06-08 ENCOUNTER — Ambulatory Visit: Payer: PPO

## 2018-06-13 NOTE — Progress Notes (Signed)
[name] [place], [place] [address] Phone: [phone] Subjective:   [name], am serving as a scribe for Dr. Hulan Saas.    CC: left thumb pain    [id number]  [name] is a 72 y.o. female coming in with complaint of left thumb trigger finger. Began 3 months ago. Sometimes painful and gets stuck.  Patient has had this for multiple months.  Seems to be increasing in frequency.  Patient having almost every morning to have to push her thumb and get it back into place.      Past Medical History:  Diagnosis Date  . 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 Varicose veins    Past Surgical History:  Procedure Laterality Date  . ABDOMINAL HYSTERECTOMY    . APPENDECTOMY    . COLECTOMY     Social History   Socioeconomic History  . Marital status: Married    Spouse name: Not on file  . Number of children: 1  . Years of education: Not on file  . Highest education level: Not on file  Occupational History  . Occupation: Warehouse manager  Social Needs  . Financial resource strain: Not on file  . Food insecurity:    Worry: Not on file    Inability: Not on file  . Transportation needs:    Medical: Not on file    Non-medical: Not on file  Tobacco Use  . Smoking status: Never Smoker  . Smokeless tobacco: Never Used  Substance and Sexual Activity  . Alcohol use: Yes    Alcohol/week: 0.0 standard drinks  . Drug use: [name]  . Sexual activity: Not on file  Lifestyle  . Physical activity:    Days per week: Not on file    Minutes per session: Not on file  . Stress: Not on file  Relationships  . Social connections:    Talks on phone: Not on file    Gets together: Not on file    Attends religious service: Not on file    Active member of club or organization: Not on file    Attends meetings of clubs or  organizations: Not on file    Relationship status: Not on file  Other Topics Concern  . Not on file  Social History Narrative  . Not on file   Allergies  Allergen Reactions  . Penicillins    Family History  Problem Relation Age of Onset  . Thyroid disease Mother   . Arthritis Mother   . Cancer Father        lung ca , cad, smoker  . Heart disease Father      Current Outpatient Medications (Cardiovascular):  .  simvastatin (ZOCOR) 20 MG tablet, Take 1 tablet (20 mg total) by mouth at bedtime.   Current Outpatient Medications (Analgesics):  .  aspirin 81 MG tablet, Take 1 tablet (81 mg total) by mouth daily. Marland Kitchen  ibuprofen (ADVIL) 200 MG tablet, Take 400 mg by mouth every 6 (six) hours as needed for pain. Reported on 12/22/2015   Current Outpatient Medications (Other):  .  cholecalciferol (VITAMIN D) 1000 units tablet, Take 2,000 Units by mouth daily. Marland Kitchen  tretinoin (RETIN-A) 0.05 % cream, APPLY TO FACE AT BEDTIME AS DIRECTED .  venlafaxine XR (EFFEXOR XR) 37.5 MG 24 hr capsule, Take 1 capsule (37.5 mg total) by mouth daily with breakfast.  Past medical history, social, surgical and family history all reviewed in electronic medical record.  [name] pertanent information unless stated regarding to the chief complaint.   Review of Systems:  [name] headache, visual changes, nausea, vomiting, diarrhea, constipation, dizziness, abdominal pain, skin rash, fevers, chills, night sweats, weight loss, swollen lymph nodes, body aches, joint swelling,  chest pain, shortness of breath, mood changes.  Positive muscle aches  Objective  Blood pressure 106/70, pulse 61, height 5\' 5"  (1.651 m), weight 123 lb (55.8 kg), SpO2 98 %.    General: [name] apparent distress alert and oriented x3 mood and affect normal, dressed appropriately.  HEENT: Pupils equal, extraocular movements intact  Respiratory: Patient's speak in full sentences and does not appear short of breath  Cardiovascular: [name] lower extremity  edema, non tender, [name] erythema  Skin: Warm dry intact with [name] signs of infection or rash on extremities or on axial skeleton.  Abdomen: Soft nontender  Neuro: Cranial nerves II through XII are intact, neurovascularly intact in all extremities with 2+ DTRs and 2+ pulses.  Lymph: [name] lymphadenopathy of posterior or anterior cervical chain or axillae bilaterally.  Gait normal with good balance and coordination.  MSK:  Non tender with full range of motion and good stability and symmetric strength and tone of shoulders, elbows, wrist, hip, knee and ankles bilaterally.  Hand exam shows the patient does have severe arthritic changes.  Atrophy of the thumb.  Thenar eminence positive CMC grind patient does have severe pain flexor tendon sheath   Procedure: Real-time Ultrasound Guided Injection of left flexor tendon sheath  device: GE Logiq Q7 Ultrasound guided injection is preferred based studies that show increased duration, increased effect, greater accuracy, decreased procedural pain, increased response rate, and decreased cost with ultrasound guided versus blind injection.  Verbal informed consent obtained.  Time-out conducted.  Noted [name] overlying erythema, induration, or other signs of local infection.  Skin prepped in a sterile fashion.  Local anesthesia: Topical Ethyl chloride.  With sterile technique and under real time ultrasound guidance: With a 25-gauge half inch needle injected with 0.5 cc of 0.5% Marcaine and 0.5 cc of Kenalog 40 mg/mL Completed without difficulty  Pain immediately resolved suggesting accurate placement of the medication.  Advised to call if fevers/chills, erythema, induration, drainage, or persistent bleeding.  Images permanently stored and available for review in the ultrasound unit.  Impression: Technically successful ultrasound guided injection.     Impression and Recommendations:     This case required medical decision making of moderate complexity. The above  documentation has been reviewed and is accurate and complete Kristie Pulley, DO       Note: This dictation was prepared with Dragon dictation along with smaller phrase technology. Any transcriptional errors that result from this process are unintentional.

## 2018-06-14 ENCOUNTER — Ambulatory Visit (INDEPENDENT_AMBULATORY_CARE_PROVIDER_SITE_OTHER): Payer: PPO | Admitting: Family Medicine

## 2018-06-14 ENCOUNTER — Encounter: Payer: Self-pay | Admitting: Family Medicine

## 2018-06-14 ENCOUNTER — Ambulatory Visit: Payer: Self-pay

## 2018-06-14 VITALS — BP 106/70 | HR 61 | Ht 65.0 in | Wt 123.0 lb

## 2018-06-14 DIAGNOSIS — M1812 Unilateral primary osteoarthritis of first carpometacarpal joint, left hand: Secondary | ICD-10-CM

## 2018-06-14 DIAGNOSIS — M1811 Unilateral primary osteoarthritis of first carpometacarpal joint, right hand: Secondary | ICD-10-CM | POA: Diagnosis not present

## 2018-06-14 DIAGNOSIS — M79645 Pain in left finger(s): Secondary | ICD-10-CM

## 2018-06-14 DIAGNOSIS — M65312 Trigger thumb, left thumb: Secondary | ICD-10-CM | POA: Insufficient documentation

## 2018-06-14 DIAGNOSIS — M180 Bilateral primary osteoarthritis of first carpometacarpal joints: Secondary | ICD-10-CM | POA: Insufficient documentation

## 2018-06-14 NOTE — Patient Instructions (Signed)
Good to see you  Alvera Singh is your friend.  Stay active Wear brace at night Continue the vitaminD  See me again in 4 weeks just in case [facility] joint needs to be addressed

## 2018-06-14 NOTE — Assessment & Plan Note (Signed)
Patient given injection.  Discussed icing regimen and home exercise.  Which activities to do which wants to avoid.  Bracing at night secondary to [facility] arthritis.  Follow-up again in 4 weeks

## 2018-07-04 ENCOUNTER — Ambulatory Visit
Admission: RE | Admit: 2018-07-04 | Discharge: 2018-07-04 | Disposition: A | Discharge status: Home / Self Care | Payer: PPO | Source: Ambulatory Visit | Attending: Family Medicine | Admitting: Family Medicine

## 2018-07-04 DIAGNOSIS — Z1231 Encounter for screening mammogram for malignant neoplasm of breast: Secondary | ICD-10-CM

## 2018-07-10 NOTE — Progress Notes (Deleted)
[name] [place], [place] [address] Phone: [phone] Subjective:    I'm seeing this patient by the request  of:    CC:   [id number]  [name] is a 72 y.o. female coming in with complaint of ***  Onset-  Location Duration-  Character- Aggravating factors- Reliving factors-  Therapies tried-  Severity-     Past Medical History:  Diagnosis Date  . 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 Varicose veins    Past Surgical History:  Procedure Laterality Date  . ABDOMINAL HYSTERECTOMY    . APPENDECTOMY    . COLECTOMY     Social History   Socioeconomic History  . Marital status: Married    Spouse name: Not on file  . Number of children: 1  . Years of education: Not on file  . Highest education level: Not on file  Occupational History  . Occupation: Warehouse manager  Social Needs  . Financial resource strain: Not on file  . Food insecurity:    Worry: Not on file    Inability: Not on file  . Transportation needs:    Medical: Not on file    Non-medical: Not on file  Tobacco Use  . Smoking status: Never Smoker  . Smokeless tobacco: Never Used  Substance and Sexual Activity  . Alcohol use: Yes    Alcohol/week: 0.0 standard drinks  . Drug use: No  . Sexual activity: Not on file  Lifestyle  . Physical activity:    Days per week: Not on file    Minutes per session: Not on file  . Stress: Not on file  Relationships  . Social connections:    Talks on phone: Not on file    Gets together: Not on file    Attends religious service: Not on file    Active member of club or organization: Not on file    Attends meetings of clubs or organizations: Not on file    Relationship status: Not on file  Other Topics Concern  . Not on file  Social History Narrative  . Not on file   Allergies  Allergen  Reactions  . Penicillins    Family History  Problem Relation Age of Onset  . Thyroid disease Mother   . Arthritis Mother   . Cancer Father        lung ca , cad, smoker  . Heart disease Father      Current Outpatient Medications (Cardiovascular):  .  simvastatin (ZOCOR) 20 MG tablet, Take 1 tablet (20 mg total) by mouth at bedtime.   Current Outpatient Medications (Analgesics):  .  aspirin 81 MG tablet, Take 1 tablet (81 mg total) by mouth daily. Marland Kitchen  ibuprofen (ADVIL) 200 MG tablet, Take 400 mg by mouth every 6 (six) hours as needed for pain. Reported on 12/22/2015   Current Outpatient Medications (Other):  .  cholecalciferol (VITAMIN D) 1000 units tablet, Take 2,000 Units by mouth daily. Marland Kitchen  tretinoin (RETIN-A) 0.05 % cream, APPLY TO FACE AT BEDTIME AS DIRECTED .  venlafaxine XR (EFFEXOR XR) 37.5 MG 24 hr capsule, Take 1 capsule (37.5 mg total) by mouth daily with breakfast.    Past medical history, social, surgical and family history all reviewed in electronic medical record.  No pertanent information unless stated regarding to the chief complaint.   Review of Systems:  No headache,  visual changes, nausea, vomiting, diarrhea, constipation, dizziness, abdominal pain, skin rash, fevers, chills, night sweats, weight loss, swollen lymph nodes, body aches, joint swelling, muscle aches, chest pain, shortness of breath, mood changes.   Objective  There were no vitals taken for this visit. Systems examined below as of    General: No apparent distress alert and oriented x3 mood and affect normal, dressed appropriately.  HEENT: Pupils equal, extraocular movements intact  Respiratory: Patient's speak in full sentences and does not appear short of breath  Cardiovascular: No lower extremity edema, non tender, no erythema  Skin: Warm dry intact with no signs of infection or rash on extremities or on axial skeleton.  Abdomen: Soft nontender  Neuro: Cranial nerves II through XII are intact,  neurovascularly intact in all extremities with 2+ DTRs and 2+ pulses.  Lymph: No lymphadenopathy of posterior or anterior cervical chain or axillae bilaterally.  Gait normal with good balance and coordination.  MSK:  Non tender with full range of motion and good stability and symmetric strength and tone of shoulders, elbows, wrist, hip, knee and ankles bilaterally.     Impression and Recommendations:     This case required medical decision making of moderate complexity. The above documentation has been reviewed and is accurate and complete Lyndal Pulley, DO       Note: This dictation was prepared with Dragon dictation along with smaller phrase technology. Any transcriptional errors that result from this process are unintentional.

## 2018-07-12 ENCOUNTER — Ambulatory Visit: Payer: PPO | Admitting: Family Medicine

## 2018-08-04 DIAGNOSIS — H00021 Hordeolum internum right upper eyelid: Secondary | ICD-10-CM | POA: Diagnosis not present

## 2018-08-04 DIAGNOSIS — H0011 Chalazion right upper eyelid: Secondary | ICD-10-CM | POA: Diagnosis not present

## 2018-08-25 DIAGNOSIS — H0011 Chalazion right upper eyelid: Secondary | ICD-10-CM | POA: Diagnosis not present

## 2018-10-20 ENCOUNTER — Ambulatory Visit (INDEPENDENT_AMBULATORY_CARE_PROVIDER_SITE_OTHER): Payer: PPO | Admitting: Family Medicine

## 2018-10-20 ENCOUNTER — Encounter: Payer: Self-pay | Admitting: Family Medicine

## 2018-10-20 VITALS — BP 120/60 | HR 52 | Temp 97.5°F | Resp 12 | Ht 65.0 in | Wt 128.1 lb

## 2018-10-20 DIAGNOSIS — R001 Bradycardia, unspecified: Secondary | ICD-10-CM

## 2018-10-20 DIAGNOSIS — L298 Other pruritus: Secondary | ICD-10-CM

## 2018-10-20 DIAGNOSIS — I7300 Raynaud's syndrome without gangrene: Secondary | ICD-10-CM

## 2018-10-20 MED ORDER — TRIAMCINOLONE ACETONIDE 0.5 % EX OINT: 1.0000 "application " | TOPICAL_OINTMENT | Freq: Two times a day (BID) | CUTANEOUS | 0 refills | Status: AC

## 2018-10-20 MED ORDER — METHYLPREDNISOLONE ACETATE 40 MG/ML IJ SUSP
40.0000 mg | Freq: Once | INTRAMUSCULAR | Status: AC
  Administered 2018-10-20: 40 mg via INTRAMUSCULAR

## 2018-10-20 MED ORDER — PREDNISONE 20 MG PO TABS: ORAL_TABLET | ORAL | 0 refills | Status: AC

## 2018-10-20 NOTE — Progress Notes (Signed)
ACUTE VISIT   HPI:  Chief Complaint  Patient presents with  . Rash    red and itchy on chest and arms, started 1 week ago    [name] is a 73 y.o. female, who is here today complaining of constant,pruritic erythematous rash noted a week ago. Rash started on neck then spread to upper chest and now she has noted some lesions on RUE.   She denies any new medication, detergent, soap, or body product. No known insect bite or outdoor exposures to plants. No sick contact. No Hx of eczema or similar rash in the past.  She wonders if rash could be related to stress.  OTC medication for this problem: Topical Benadryl, it caused burning sensation,like she was "on fire."    Negative for oral lesions/edema,cough, wheezing, dyspnea, abdominal pain, nausea, or vomiting.  She is also c/o intermittent fingertips cyanosis and pain. Exacerbated by cold air. Alleviated by applying heat. She has not noted ulcers or skin lesions. Problem has been going on for years and has been stable.   Noted bradycardia, she has not noted dizziness or palpitations. Hx of PAC's. She has ahd HR on lower normal range. Denies chest pain, dyspnea,or claudication.    Review of Systems  Constitutional: Negative for appetite change, chills, fatigue and fever.  HENT: Negative for congestion, mouth sores, sore throat, trouble swallowing and voice change.   Eyes: Negative for discharge and redness.  Respiratory: Negative for cough, shortness of breath and wheezing.   Gastrointestinal: Negative for abdominal pain, diarrhea, nausea and vomiting.  Musculoskeletal: Positive for arthralgias. Negative for joint swelling and myalgias.  Skin: Positive for rash. Negative for wound.  Allergic/Immunologic: Negative for environmental allergies and food allergies.  Neurological: Negative for weakness, numbness and headaches.      Current Outpatient Medications on File Prior to Visit  Medication  Sig Dispense Refill  . cholecalciferol (VITAMIN D) 1000 units tablet Take 2,000 Units by mouth daily.     No current facility-administered medications on file prior to visit.      Past Medical History:  Diagnosis Date  . 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 Varicose veins    Allergies  Allergen Reactions  . Penicillins Rash    Social History   Socioeconomic History  . Marital status: Married    Spouse name: Not on file  . Number of children: 1  . Years of education: Not on file  . Highest education level: Not on file  Occupational History  . Occupation: Warehouse manager  Social Needs  . Financial resource strain: Not on file  . Food insecurity:    Worry: Not on file    Inability: Not on file  . Transportation needs:    Medical: Not on file    Non-medical: Not on file  Tobacco Use  . Smoking status: Never Smoker  . Smokeless tobacco: Never Used  Substance and Sexual Activity  . Alcohol use: Yes    Alcohol/week: 0.0 standard drinks  . Drug use: No  . Sexual activity: Not on file  Lifestyle  . Physical activity:    Days per week: Not on file    Minutes per session: Not on file  . Stress: Not on file  Relationships  . Social connections:    Talks on phone: Not on file    Gets together: Not on file  Attends religious service: Not on file    Active member of club or organization: Not on file    Attends meetings of clubs or organizations: Not on file    Relationship status: Not on file  Other Topics Concern  . Not on file  Social History Narrative  . Not on file    Vitals:   10/20/18 1110  BP: 120/60  Pulse: (!) 52  Resp: 12  Temp: (!) 97.5 F (36.4 C)  SpO2: 100%   There is no height or weight on file to calculate BMI.    Physical Exam  Nursing note and vitals reviewed. Constitutional: She is oriented to person, place, and time. She  appears well-developed and well-nourished. No distress.  HENT:  Head: Normocephalic and atraumatic.  Mouth/Throat: Oropharynx is clear and moist and mucous membranes are normal.  Eyes: Conjunctivae are normal.  Cardiovascular: Regular rhythm. Bradycardia present.  No murmur heard. Pulses:      Radial pulses are 2+ on the right side and 2+ on the left side.  HR 56/min by my count. Cold and mildly cyanotic fingers. Good capillary refill.  Respiratory: Effort normal and breath sounds normal. No respiratory distress.  Musculoskeletal:        General: No edema.  Lymphadenopathy:    She has no cervical adenopathy.  Neurological: She is alert and oriented to person, place, and time. She has normal strength. Gait normal.  Skin: Skin is warm. Rash noted.  Psychiatric: She has a normal mood and affect.  Well groomed, good eye contact.        ASSESSMENT AND PLAN:   Ms. [name] was seen today for rash.  Diagnoses and all orders for this visit:  Pruritic erythematous rash We discussed possible causes. ? Photosensitivity, contact dermatitis, eczema. Here and after verbal consent she received Depo-Medrol 40 mg IM. Tomorrow she will start oral prednisone taper. Side effects discussed. Topical steroid ointment bid as needed. Zyrtec 10 mg bid for 5-7 day. If this become recurrent derma evaluation may need to be considered.  -     predniSONE (DELTASONE) 20 MG tablet; 2 tabs for 5 days, 1 tabs for 4 days,and 1/2 tab for 3 days. Take tables together with breakfast. -     triamcinolone ointment (KENALOG) 0.5 %; Apply 1 application topically 2 (two) times daily for 14 days. -     methylPREDNISolone acetate (DEPO-MEDROL) injection 40 mg  Raynaud's phenomenon without gangrene Educated about Dx. Chronic problem. Treatment options and possible etiologies discussed. She is not interested in trying Amlodipine for now. Recommend avoid trigger factors. Instructed about warning  signs.   Bradycardia, sinus Asymptomatic.  Recommend monitoring HR periodically. Instructed about warning sings.    Return if symptoms worsen or fail to improve.      Taraoluwa Thakur G. Martinique, MD  [facility]. [place] office.

## 2018-10-20 NOTE — Patient Instructions (Signed)
A few things to remember from today's visit:   Pruritic erythematous rash - Plan: predniSONE (DELTASONE) 20 MG tablet, triamcinolone ointment (KENALOG) 0.5 %  ? Contact dermatitis.  If problem is not improved or you have recurrent symptoms, we may need to consider appointment with allergy doctor or dermatologist.  Please be sure medication list is accurate. If a new problem present, please set up appointment sooner than planned today.

## 2018-10-21 ENCOUNTER — Encounter: Payer: Self-pay | Admitting: Family Medicine

## 2019-05-02 ENCOUNTER — Other Ambulatory Visit: Payer: Self-pay

## 2019-05-04 ENCOUNTER — Telehealth: Payer: Self-pay

## 2019-05-04 NOTE — Telephone Encounter (Signed)
Copied from [place] [id number]. Topic: General - Other >> May 03, 2019  8:27 AM Ivar Drape wrote: Reason for CRM:   Patient would like a return call on the status of some paperwork that was suppose to be filled out and sent to her lawyer.

## 2019-05-04 NOTE — Telephone Encounter (Signed)
Spoke with the patient and she advised me that this paper work was given to the nurse on Monday.   Please advise have you seen these papers?

## 2019-05-08 NOTE — Telephone Encounter (Signed)
Dr. Martinique do you have these forms

## 2019-05-09 NOTE — Telephone Encounter (Signed)
I did not receive paperwork, I did provide a letter,which I signed and gave back to her during the visit. If something has been faxed I have not received it. Thanks, BJ

## 2019-07-31 ENCOUNTER — Encounter: Payer: Self-pay | Admitting: Family Medicine

## 2019-07-31 ENCOUNTER — Other Ambulatory Visit: Payer: Self-pay

## 2019-07-31 ENCOUNTER — Ambulatory Visit (INDEPENDENT_AMBULATORY_CARE_PROVIDER_SITE_OTHER): Payer: PPO | Admitting: Family Medicine

## 2019-07-31 ENCOUNTER — Ambulatory Visit: Payer: Self-pay

## 2019-07-31 VITALS — BP 100/62 | HR 68 | Ht 65.0 in | Wt 121.0 lb

## 2019-07-31 DIAGNOSIS — M25561 Pain in right knee: Secondary | ICD-10-CM

## 2019-07-31 DIAGNOSIS — S83241A Other tear of medial meniscus, current injury, right knee, initial encounter: Secondary | ICD-10-CM

## 2019-07-31 MED ORDER — VITAMIN D (ERGOCALCIFEROL) 1.25 MG (50000 UNIT) PO CAPS: 50000.0000 [IU] | ORAL_CAPSULE | ORAL | 0 refills | Status: DC

## 2019-07-31 MED ORDER — GABAPENTIN 100 MG PO CAPS: 200.0000 mg | ORAL_CAPSULE | Freq: Every day | ORAL | 3 refills | Status: DC

## 2019-07-31 NOTE — Assessment & Plan Note (Addendum)
Patient does have a meniscal tear.  Discussed with patient icing regimen, home exercise, which activities to do which wants to avoid.  Patient is to increase activity slowly.  Follow-up again in 4 to 8 weeks worsening pain consider possible

## 2019-07-31 NOTE — Progress Notes (Signed)
[name] [place], [place] [address] Phone: [phone] Subjective:   [name], am serving as a scribe for Dr. Hulan Saas.     CC: Right knee new injury  [id number]   06/14/2018 Patient given injection.  Discussed icing regimen and home exercise.  Which activities to do which wants to avoid.  Bracing at night secondary to [facility] arthritis.  Follow-up again in 4   Update 07/31/2019 [name] is a 73 y.o. female coming in with complaint of right knee. Patient felt a twinge when playing tennis in the cold. Pain over the medial aspect of knee occurring for past 10 days. Pain is improving. Stairs can cause a sharp pain with stairs but always has constant dull ache.    Also having left hip pain to her knee when lying in bed.  States that it is mild, nothing that stops her during the activity, denies any numbness in the lower extremities.     Past Medical History:  Diagnosis Date  . 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 Varicose veins    Past Surgical History:  Procedure Laterality Date  . ABDOMINAL HYSTERECTOMY    . APPENDECTOMY    . COLECTOMY     Social History   Socioeconomic History  . Marital status: Married    Spouse name: Not on file  . Number of children: 1  . Years of education: Not on file  . Highest education level: Not on file  Occupational History  . Occupation: Warehouse manager  Social Needs  . Financial resource strain: Not on file  . Food insecurity    Worry: Not on file    Inability: Not on file  . Transportation needs    Medical: Not on file    Non-medical: Not on file  Tobacco Use  . Smoking status: Never Smoker  . Smokeless tobacco: Never Used  Substance and Sexual Activity  . Alcohol use: Yes    Alcohol/week: 0.0 standard drinks  . Drug use: No  . Sexual activity: Not on  file  Lifestyle  . Physical activity    Days per week: Not on file    Minutes per session: Not on file  . Stress: Not on file  Relationships  . Social Herbalist on phone: Not on file    Gets together: Not on file    Attends religious service: Not on file    Active member of club or organization: Not on file    Attends meetings of clubs or organizations: Not on file    Relationship status: Not on file  Other Topics Concern  . Not on file  Social History Narrative  . Not on file   Allergies  Allergen Reactions  . Penicillins Rash   Family History  Problem Relation Age of Onset  . Thyroid disease Mother   . Arthritis Mother   . Cancer Father        lung ca , cad, smoker  . Heart disease Father          Current Outpatient Medications (Other):  .  cholecalciferol (VITAMIN D) 1000 units tablet, Take 2,000 Units by mouth daily.    Past medical history, social, surgical and family history all reviewed in electronic medical record.  No pertanent information unless stated regarding to the chief complaint.   Review of Systems:  No headache, visual changes, nausea, vomiting, diarrhea, constipation, dizziness, abdominal pain, skin rash, fevers, chills, night sweats, weight loss, swollen lymph nodes, body aches, joint swelling,  chest pain, shortness of breath, mood changes.  Positive muscle aches  Objective  Blood pressure 100/62, pulse 68, height 5\' 5"  (1.651 m), weight 121 lb (54.9 kg), SpO2 99 %.     General: No apparent distress alert and oriented x3 mood and affect normal, dressed appropriately.  HEENT: Pupils equal, extraocular movements intact  Respiratory: Patient's speak in full sentences and does not appear short of breath  Cardiovascular: No lower extremity edema, non tender, no erythema  Skin: Warm dry intact with no signs of infection or rash on extremities or on axial skeleton.  Abdomen: Soft nontender  Neuro: Cranial nerves II through XII are  intact, neurovascularly intact in all extremities with 2+ DTRs and 2+ pulses.  Lymph: No lymphadenopathy of posterior or anterior cervical chain or axillae bilaterally.  Gait normal with good balance and coordination.  MSK:  tender with full range of motion and good stability and symmetric strength and tone of shoulders, elbows, wrist, hip, and ankles bilaterally.   Right knee exam on inspection trace swelling compared to the contralateral side.  Tender to palpation over the medial joint line.  Positive McMurray's, full range of motion otherwise.  Mild crepitus with patellar grind test.  No true instability of the knee And contralateral knee some mild tenderness over the medial joint space but minimal  Limited musculoskeletal ultrasound was performed and interpreted by Lyndal Pulley  Limited ultrasound of patient's right knee shows the patient does have more of a moderate narrowing of the medial compartment.  Patient does have what appears to be an acute on chronic medial meniscus.  Medial displacement. Medial meniscal tear acute on chronic    Impression and Recommendations:     This case required medical decision making of moderate complexity. The above documentation has been reviewed and is accurate and complete Lyndal Pulley, DO       Note: This dictation was prepared with Dragon dictation along with smaller phrase technology. Any transcriptional errors that result from this process are unintentional.

## 2019-08-06 ENCOUNTER — Other Ambulatory Visit: Payer: Self-pay | Admitting: Family Medicine

## 2019-08-06 DIAGNOSIS — Z1231 Encounter for screening mammogram for malignant neoplasm of breast: Secondary | ICD-10-CM

## 2019-09-04 ENCOUNTER — Ambulatory Visit: Payer: PPO | Admitting: Family Medicine

## 2019-10-01 ENCOUNTER — Ambulatory Visit: Payer: PPO

## 2019-10-09 ENCOUNTER — Other Ambulatory Visit: Payer: Self-pay

## 2019-10-09 ENCOUNTER — Ambulatory Visit
Admission: RE | Admit: 2019-10-09 | Discharge: 2019-10-09 | Disposition: A | Discharge status: Home / Self Care | Payer: PPO | Source: Ambulatory Visit | Attending: Family Medicine | Admitting: Family Medicine

## 2019-10-09 DIAGNOSIS — Z1231 Encounter for screening mammogram for malignant neoplasm of breast: Secondary | ICD-10-CM | POA: Diagnosis not present

## 2019-10-10 ENCOUNTER — Other Ambulatory Visit: Payer: Self-pay | Admitting: Family Medicine

## 2019-10-10 DIAGNOSIS — R928 Other abnormal and inconclusive findings on diagnostic imaging of breast: Secondary | ICD-10-CM

## 2019-10-11 ENCOUNTER — Telehealth: Payer: Self-pay | Admitting: *Deleted

## 2019-10-11 ENCOUNTER — Ambulatory Visit: Payer: PPO | Attending: Internal Medicine

## 2019-10-11 DIAGNOSIS — Z20822 Contact with and (suspected) exposure to covid-19: Secondary | ICD-10-CM

## 2019-10-11 NOTE — Telephone Encounter (Signed)
Copied from [place] [id number]. Topic: General - Other >> Oct 11, 2019 10:57 AM Antonieta Iba C wrote: Reason for CRM: pt called in because her spouse tested positive for covid. Pt would like to know what should she do?  Assisted pt with scheduling for covid testing. Pt says that she is not currently having any symptoms.

## 2019-10-12 NOTE — Telephone Encounter (Signed)
Pt was tested yesterday.

## 2019-10-13 LAB — NOVEL CORONAVIRUS, NAA: SARS-CoV-2, NAA: NOT DETECTED

## 2019-10-16 ENCOUNTER — Other Ambulatory Visit: Payer: PPO

## 2019-10-19 ENCOUNTER — Telehealth (INDEPENDENT_AMBULATORY_CARE_PROVIDER_SITE_OTHER): Payer: PPO | Admitting: Family Medicine

## 2019-10-19 ENCOUNTER — Encounter: Payer: Self-pay | Admitting: Family Medicine

## 2019-10-19 ENCOUNTER — Encounter: Payer: Self-pay | Admitting: *Deleted

## 2019-10-19 VITALS — HR 88

## 2019-10-19 DIAGNOSIS — J309 Allergic rhinitis, unspecified: Secondary | ICD-10-CM | POA: Diagnosis not present

## 2019-10-19 DIAGNOSIS — J321 Chronic frontal sinusitis: Secondary | ICD-10-CM | POA: Diagnosis not present

## 2019-10-19 DIAGNOSIS — Z20822 Contact with and (suspected) exposure to covid-19: Secondary | ICD-10-CM

## 2019-10-19 MED ORDER — DOXYCYCLINE HYCLATE 100 MG PO TABS: 100.0000 mg | ORAL_TABLET | Freq: Two times a day (BID) | ORAL | 0 refills | Status: AC

## 2019-10-19 MED ORDER — FLUTICASONE PROPIONATE 50 MCG/ACT NA SUSP: 1.0000 | Freq: Two times a day (BID) | NASAL | 3 refills | Status: DC

## 2019-10-19 MED ORDER — PREDNISONE 20 MG PO TABS: 40.0000 mg | ORAL_TABLET | Freq: Every day | ORAL | 0 refills | Status: AC

## 2019-10-19 NOTE — Progress Notes (Signed)
Virtual Visit via Video Note   I connected with [name] on 10/19/19 by a video enabled telemedicine application and verified that I am speaking with the correct person using two identifiers.  Location patient: home Location provider:work office Persons participating in the virtual visit: patient, provider  I discussed the limitations of evaluation and management by telemedicine and the availability of in person appointments. The patient expressed understanding and agreed to proceed.  HPI: [name] is a 73 yo female c/o 2 weeks of frontal pressure headache, "sinus pain." Pain is constant, 3/10, it has been a stable. Intermittent burning nose sensation. Rhinorrhea and nasal congestion. She thinks she may have allergies because she keeps rhinorrhea and congestion intermittently throughout the year. Yesterday she started with nonproductive cough.  Has been is currently in the hospital with diagnosis of COVID-19 infection. He was diagnosed on 10/09/2019 and she was screened on 10/13/2019, negative. Negative for changes in the smell or taste.  Negative for [name], chills, body aches, sore throat, dyspnea, wheezing, abdominal pain, nausea, vomiting, or skin rash. She has taking Zyrtec, which is not helping.  ROS: See pertinent positives and negatives per HPI.  Past Medical History:  Diagnosis Date  . 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 Varicose veins     Past Surgical History:  Procedure Laterality Date  . ABDOMINAL HYSTERECTOMY    . APPENDECTOMY    . COLECTOMY      Family History  Problem Relation Age of Onset  . Thyroid disease Mother   . Arthritis Mother   . Cancer Father        lung ca , cad, smoker  . Heart disease Father     Social History   Socioeconomic History  . Marital status: Married    Spouse name: Not on file  . Number of children: 1  .  Years of education: Not on file  . Highest education level: Not on file  Occupational History  . Occupation: Warehouse manager  Tobacco Use  . Smoking status: Never Smoker  . Smokeless tobacco: Never Used  Substance and Sexual Activity  . Alcohol use: Yes    Alcohol/week: 0.0 standard drinks  . Drug use: No  . Sexual activity: Not on file  Other Topics Concern  . Not on file  Social History Narrative  . Not on file   Social Determinants of Health   Financial Resource Strain:   . Difficulty of Paying Living Expenses: Not on file  Food Insecurity:   . Worried About Charity fundraiser in the Last Year: Not on file  . Ran Out of Food in the Last Year: Not on file  Transportation Needs:   . Lack of Transportation (Medical): Not on file  . Lack of Transportation (Non-Medical): Not on file  Physical Activity:   . Days of Exercise per Week: Not on file  . Minutes of Exercise per Session: Not on file  Stress:   . Feeling of Stress : Not on file  Social Connections:   . Frequency of Communication with Friends and Family: Not on file  . Frequency of Social Gatherings with Friends and Family: Not on file  . Attends Religious Services: Not on file  . Active Member of Clubs or Organizations: Not on file  . Attends Archivist Meetings: Not on file  . Marital Status: Not on file  Intimate Partner Violence:   .  Fear of Current or Ex-Partner: Not on file  . Emotionally Abused: Not on file  . Physically Abused: Not on file  . Sexually Abused: Not on file    Current Outpatient Medications:  .  cholecalciferol (VITAMIN D) 1000 units tablet, Take 2,000 Units by mouth daily., Disp: , Rfl:  .  doxycycline (VIBRA-TABS) 100 MG tablet, Take 1 tablet (100 mg total) by mouth 2 (two) times daily for 10 days., Disp: 20 tablet, Rfl: 0 .  fluticasone (FLONASE) 50 MCG/ACT nasal spray, Place 1 spray into both nostrils 2 (two) times daily., Disp: 16 g, Rfl: 3 .  predniSONE (DELTASONE) 20 MG  tablet, Take 2 tablets (40 mg total) by mouth daily with breakfast for 3 days., Disp: 6 tablet, Rfl: 0  EXAM:  VITALS per patient if applicable:Pulse 88   SpO2 96%   GENERAL: alert, oriented, appears well and in no acute distress  HEENT: atraumatic, conjunctiva clear, no obvious abnormalities on inspection of external nose and ears.  Facial edema or erythema not present.  NECK: normal movements of the head and neck  LUNGS: on inspection no signs of respiratory distress, breathing rate appears normal, no obvious gross SOB, gasping or wheezing  CV: no obvious cyanosis  Kristie: moves all visible extremities without noticeable abnormality  PSYCH/NEURO: pleasant and cooperative, no obvious depression or anxiety, speech and thought processing grossly intact  ASSESSMENT AND PLAN:  Discussed the following assessment and plan:  Frontal sinusitis, unspecified chronicity - Plan: doxycycline (VIBRA-TABS) 100 MG tablet We discussed possible causes of frontal headache. Some side effects of abx discussed. If viral or allergic etiology abx will not help. Allergic to penicillin. She could hold on abx for 3 days and start if not feeling any better with symptomatic treatment.  Allergic rhinitis, unspecified seasonality, unspecified trigger - Plan: predniSONE (DELTASONE) 20 MG tablet, fluticasone (FLONASE) 50 MCG/ACT nasal spray Prednisone may help with sinus pressure. She agrees with short course of Prednisone,instructed to take med with breakfast, some side effects discussed. Saline nasal irrigations as needed and Flonase nasal spray  Close exposure to COVID-19 virus She was instructed to monitor for [name], new associated symptoms, or worsening cough; in which case she may need to be rescreen for COVID-19 infection. Continue quarantine.  I discussed the assessment and treatment plan with the patient.  Kristie. [name] was provided an opportunity to ask questions and all were answered. She agreed with  the plan and demonstrated an understanding of the instructions.    Return if symptoms worsen or fail to improve.    Darrell Hauk [place], MD

## 2019-10-23 DIAGNOSIS — Z20822 Contact with and (suspected) exposure to covid-19: Secondary | ICD-10-CM | POA: Diagnosis not present

## 2019-10-23 DIAGNOSIS — Z20828 Contact with and (suspected) exposure to other viral communicable diseases: Secondary | ICD-10-CM | POA: Diagnosis not present

## 2019-10-24 ENCOUNTER — Ambulatory Visit: Payer: Self-pay

## 2019-10-24 ENCOUNTER — Other Ambulatory Visit: Payer: Self-pay

## 2019-10-24 ENCOUNTER — Emergency Department (HOSPITAL_COMMUNITY)
Admission: EM | Admit: 2019-10-24 | Discharge: 2019-10-24 | Disposition: A | Discharge status: Home / Self Care | Payer: PPO | Attending: Emergency Medicine | Admitting: Emergency Medicine

## 2019-10-24 ENCOUNTER — Emergency Department (HOSPITAL_COMMUNITY): Payer: PPO

## 2019-10-24 ENCOUNTER — Telehealth (INDEPENDENT_AMBULATORY_CARE_PROVIDER_SITE_OTHER): Payer: PPO | Admitting: Family Medicine

## 2019-10-24 ENCOUNTER — Telehealth: Payer: Self-pay | Admitting: Family Medicine

## 2019-10-24 ENCOUNTER — Encounter (HOSPITAL_COMMUNITY): Payer: Self-pay | Admitting: Emergency Medicine

## 2019-10-24 ENCOUNTER — Encounter: Payer: Self-pay | Admitting: Family Medicine

## 2019-10-24 DIAGNOSIS — Z79899 Other long term (current) drug therapy: Secondary | ICD-10-CM | POA: Insufficient documentation

## 2019-10-24 DIAGNOSIS — R5383 Other fatigue: Secondary | ICD-10-CM | POA: Diagnosis not present

## 2019-10-24 DIAGNOSIS — J449 Chronic obstructive pulmonary disease, unspecified: Secondary | ICD-10-CM | POA: Diagnosis not present

## 2019-10-24 DIAGNOSIS — R110 Nausea: Secondary | ICD-10-CM

## 2019-10-24 DIAGNOSIS — R0789 Other chest pain: Secondary | ICD-10-CM | POA: Diagnosis not present

## 2019-10-24 DIAGNOSIS — U071 COVID-19: Secondary | ICD-10-CM | POA: Diagnosis not present

## 2019-10-24 DIAGNOSIS — R05 Cough: Secondary | ICD-10-CM

## 2019-10-24 DIAGNOSIS — R059 Cough, unspecified: Secondary | ICD-10-CM

## 2019-10-24 LAB — CBC
HCT: 42.2 % (ref 36.0–46.0)
Hemoglobin: 14.2 g/dL (ref 12.0–15.0)
MCH: 32.3 pg (ref 26.0–34.0)
MCHC: 33.6 g/dL (ref 30.0–36.0)
MCV: 96.1 fL (ref 80.0–100.0)
Platelets: 201 10*3/uL (ref 150–400)
RBC: 4.39 MIL/uL (ref 3.87–5.11)
RDW: 12.2 % (ref 11.5–15.5)
WBC: 5.5 10*3/uL (ref 4.0–10.5)
nRBC: 0 % (ref 0.0–0.2)

## 2019-10-24 LAB — POC SARS CORONAVIRUS 2 AG -  ED: SARS Coronavirus 2 Ag: POSITIVE — AB

## 2019-10-24 LAB — BASIC METABOLIC PANEL
Anion gap: 12 (ref 5–15)
BUN: 17 mg/dL (ref 8–23)
CO2: 26 mmol/L (ref 22–32)
Calcium: 9.5 mg/dL (ref 8.9–10.3)
Chloride: 101 mmol/L (ref 98–111)
Creatinine, Ser: 0.97 mg/dL (ref 0.44–1.00)
GFR calc Af Amer: >60 mL/min (ref >60)
GFR calc non Af Amer: 58 mL/min — ABNORMAL LOW (ref >60)
Glucose, Bld: 113 mg/dL — ABNORMAL HIGH (ref 70–99)
Potassium: 3.3 mmol/L — ABNORMAL LOW (ref 3.5–5.1)
Sodium: 139 mmol/L (ref 135–145)

## 2019-10-24 LAB — TROPONIN I (HIGH SENSITIVITY): Troponin I (High Sensitivity): 3 ng/L (ref <18)

## 2019-10-24 MED ORDER — ONDANSETRON HCL 4 MG/2ML IJ SOLN
4.0000 mg | Freq: Once | INTRAMUSCULAR | Status: AC
  Administered 2019-10-24: 4 mg via INTRAVENOUS
  Filled 2019-10-24: qty 2

## 2019-10-24 MED ORDER — SODIUM CHLORIDE 0.9 % IV BOLUS
500.0000 mL | Freq: Once | INTRAVENOUS | Status: AC
  Administered 2019-10-24: 14:00:00 500 mL via INTRAVENOUS

## 2019-10-24 MED ORDER — ONDANSETRON 4 MG PO TBDP: 4.0000 mg | ORAL_TABLET | Freq: Three times a day (TID) | ORAL | 0 refills | Status: DC | PRN

## 2019-10-24 NOTE — Discharge Instructions (Addendum)
You tested positive for COVID-19.    Please quarantine at home and read over the information attached.  Your potassium was slightly low today (3.3).  Make an effort to eat some potassium-containing food like a banana daily.  You can take your zofran your doctor prescribed for nausea at home.

## 2019-10-24 NOTE — Telephone Encounter (Signed)
Pt asked for a call from the nurse or her PCP/ she stated she is nauseous and has tightness in her chest/ she called the EMS and they said she was mildly dehydrated and may have covid but her vitals were not serious enough to go to ER. / please advise   Pt was sent to triage nurse line

## 2019-10-24 NOTE — ED Provider Notes (Signed)
[facility] DEPT Provider Note   CSN: [id number] Arrival date & time: 10/24/19  1252     History Chief Complaint  Patient presents with  . Nausea  . chest pain  . Back Pain  . Covid exposure    [name] is a 74 y.o. 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  The patient reports that her doctor prescribed her doxycycline for a sinus infection which she began taking 4 days ago.  She reports that she began having an upset stomach stop taking that medicine.  She says that beginning yesterday into today she has had worsening nausea as well as a tightness in her right side of her chest.  He says the tightness is constant.  She is unsure if it is related to respirations.  It occurs whether she is moving and at rest.  She denies any history of cardiac disease, prior anginal symptoms, MI, cardiac stents, smoking, hypertension, hyperlipidemia, diabetes.  She denies cough, congestion, fevers or chills  She was discussing her chest tightness with her primary care provider on a virtual visit yesterday and her provider encouraged her to come to the emergency department.  Her husband has COVID-19 and is currently hospitalized.  She reports she was tested for COVID yesterday but does not yet have results.  HPI  HPI: A 74 year old patient presents for evaluation of chest pain. Initial onset of pain was more than 6 hours ago. The patient's chest pain is described as heaviness/pressure/tightness and is not worse with exertion. The patient complains of nausea. The patient's chest pain is not middle- or left-sided, is not well-localized, is not sharp and does not radiate to the arms/jaw/neck. The patient denies diaphoresis. The patient has no history of stroke, has no history of peripheral artery disease, has not smoked in the past 90  days, denies any history of treated diabetes, has no relevant family history of coronary artery disease (first degree relative at less than age 29), is not hypertensive, has no history of hypercholesterolemia and does not have an elevated BMI (>=30).   Past Medical History:  Diagnosis Date  . 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 Varicose veins     Patient Active Problem List   Diagnosis Date Noted  . Acute medial meniscus tear of right knee 07/31/2019  . Raynaud phenomenon 10/20/2018  . Trigger thumb of left hand 06/14/2018  . Arthritis of carpometacarpal (CMC) joint of both thumbs 06/14/2018  . 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 Hot flash not due to menopause 04/04/2018  . Acute medial meniscal tear, left, initial encounter 10/14/2017  . Counseling for estrogen replacement therapy 12/23/2015  . Environmental and seasonal allergies 12/23/2015  . H/O right hemicolectomy 12/23/2015  . Varicose veins of leg with complications [id number]  . 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 ABDOMINAL HYSTERECTOMY    . APPENDECTOMY    . COLECTOMY       OB History   No obstetric history on file.     Family History  Problem Relation Age of Onset  . Thyroid disease Mother   . Arthritis Mother   . Cancer Father        lung ca , cad, smoker  . Heart disease Father  Social History   Tobacco Use  . Smoking status: Never Smoker  . Smokeless tobacco: Never Used  Substance Use Topics  . Alcohol use: Yes    Alcohol/week: 0.0 standard drinks  . Drug use: No    Home Medications Prior to Admission medications   Medication Sig Start Date End Date Taking? Authorizing Provider  cholecalciferol (VITAMIN D) 1000 units tablet Take 2,000 Units by mouth daily.    [provider]  doxycycline (VIBRA-TABS) 100  MG tablet Take 1 tablet (100 mg total) by mouth 2 (two) times daily for 10 days. 10/19/19 10/29/19  Martin[place], Betty G, MD  fluticasone (FLONASE) 50 MCG/ACT nasal spray Place 1 spray into both nostrils 2 (two) times daily. 10/19/19   [place], Betty G, MD  ondansetron (ZOFRAN ODT) 4 MG disintegrating tablet Take 1 tablet (4 mg total) by mouth every 8 (eight) hours as needed for nausea or vomiting. 10/24/19   Burchette, Alinda Sierras, MD    Allergies    Penicillins  Review of Systems   Review of Systems  Constitutional: Negative for chills and fever.  HENT: Positive for congestion and sinus pressure.   Respiratory: Positive for chest tightness. Negative for cough.   Cardiovascular: Positive for chest pain. Negative for palpitations and leg swelling.  Gastrointestinal: Positive for nausea. Negative for abdominal pain and vomiting.  Neurological: Negative for syncope and speech difficulty.  Psychiatric/Behavioral: Negative for agitation and confusion.  All other systems reviewed and are negative.   Physical Exam Updated Vital Signs BP (!) 135/96 (BP Location: Right Arm)   Pulse 75   Temp 98 F (36.7 C) (Oral)   Resp 16   SpO2 100%   Physical Exam Vitals and nursing note reviewed.  Constitutional:      General: She is not in acute distress.    Appearance: She is well-developed.  HENT:     Head: Normocephalic and atraumatic.  Eyes:     Conjunctiva/sclera: Conjunctivae normal.  Cardiovascular:     Rate and Rhythm: Normal rate and regular rhythm.     Pulses: Normal pulses.  Pulmonary:     Effort: Pulmonary effort is normal. No respiratory distress.     Breath sounds: Normal breath sounds.  Abdominal:     General: There is no distension.     Palpations: Abdomen is soft.     Tenderness: There is no abdominal tenderness. Negative signs include Murphy's sign and McBurney's sign.  Musculoskeletal:     Cervical back: Neck supple.  Skin:    General: Skin is warm and dry.  Neurological:      Mental Status: She is alert.  Psychiatric:        Mood and Affect: Mood normal.        Behavior: Behavior normal.     ED Results / Procedures / Treatments   Labs (all labs ordered are listed, but only abnormal results are displayed) Labs Reviewed  BASIC METABOLIC PANEL - Abnormal; Notable for the following components:      Result Value   Potassium 3.3 (*)    Glucose, Bld 113 (*)    GFR calc non Af Amer 58 (*)    All other components within normal limits  POC SARS CORONAVIRUS 2 AG -  ED - Abnormal; Notable for the following components:   SARS Coronavirus 2 Ag POSITIVE (*)    All other components within normal limits  CBC  TROPONIN I (HIGH SENSITIVITY)    EKG EKG Interpretation  Date/Time:  Wednesday October 24 2019 13:03:20 EST Ventricular Rate:  78 PR Interval:  134 QRS Duration: 70 QT Interval:  364 QTC Calculation: 414 R Axis:   75 Text Interpretation: Sinus rhythm with Premature atrial complexes Otherwise normal ECG NO STEMI Confirmed by Octaviano Glow [phone]) on 10/24/2019 1:10:00 PM   Radiology DG Chest 2 View  Result Date: 10/24/2019 CLINICAL DATA:  Nausea, chest tightness, pending COVID-19 test, husband admitted for COVID-19, history of pseudomyxoma peritonei EXAM: CHEST - 2 VIEW COMPARISON:  None FINDINGS: Normal heart size, mediastinal contours, and pulmonary vascularity. Atherosclerotic calcification aorta. Emphysematous changes and central peribronchial thickening consistent with COPD. No acute infiltrate, pleural effusion, or pneumothorax. Bones demineralized. IMPRESSION: COPD changes without acute infiltrate. Electronically Signed   By: Lavonia Dana M.D.   On: 10/24/2019 13:25    Procedures Procedures (including critical care time)  Medications Ordered in ED Medications  ondansetron (ZOFRAN) injection 4 mg (4 mg Intravenous Given 10/24/19 1423)  sodium chloride 0.9 % bolus 500 mL (0 mLs Intravenous Stopped 10/24/19 1500)    ED Course  I have reviewed the  triage vital signs and the nursing notes.  Pertinent labs & imaging results that were available during my care of the patient were reviewed by me and considered in my medical decision making (see chart for details).  74 yo female presenting with chest tightness x 2 days, reporting also some nausea.  She reports her husband recently diagnosed with COVID-19 is in the hospital.  She has a Covid test taken yesterday and is pending.  She came to the ED because her primary care provider urged her to come in for cardiac evaluation after describing her chest tightness.  She is well-appearing on exam.  She has a benign EKG.  She is low risk for cardiac disease based on her risk factors.  I'll check a troponin and calculate a HEART score.  My suspicion is that this is likely a viral syndrome, so we'll recheck Covid here.  Also plan for xray to evaluate her lung fields  If her workup is benign I anticipate discharge home  [name] was evaluated in Emergency Department on 10/24/2019 for the symptoms described in the history of present illness. She was evaluated in the context of the global COVID-19 pandemic, which necessitated consideration that the patient might be at risk for infection with the SARS-CoV-2 virus that causes COVID-19. Institutional protocols and algorithms that pertain to the evaluation of patients at risk for COVID-19 are in a state of rapid change based on information released by regulatory bodies including the CDC and federal and state organizations. These policies and algorithms were followed during the patient's care in the ED.  This note was dictated using dragon dictation software.  Please be aware that there may be minor translation errors as a result of this oral dictation     Final Clinical Impression(s) / ED Diagnoses Final diagnoses:  COVID-19    Rx / DC Orders ED Discharge Orders    None       Blandina Renaldo, Carola Rhine, MD 10/24/19 1842

## 2019-10-24 NOTE — ED Triage Notes (Signed)
Pt reports nausea and chest tightness. Had a virtual appt with PCP today who advised to go to ED for chest pains. Reports had a Covid test yesterday but pending results. Pt husband currently admitted here for Covid since last Thursday.

## 2019-10-24 NOTE — Telephone Encounter (Signed)
Pt is scheduled with Dr. Elease Hashimoto for 11:15 this morning.

## 2019-10-24 NOTE — Progress Notes (Deleted)
  Subjective:     Patient ID: [name], female   DOB: 1946/07/12, 74 y.o.   MRN: [id number]  HPI   Review of Systems     Objective:   Physical Exam     Assessment:     ***    Plan:     ***

## 2019-10-24 NOTE — Telephone Encounter (Signed)
Pt. Called to report c/o chest tightness and nausea.  Stated the chest tightness is in center of chest and goes through to the back; denied radiation to neck, jaw or left shoulder/arm.  Rated tightness 3-4/10.  C/o constant nausea.  Reported she called EMS this morning, and was advised her VS were stable, and that she poss. has COVID, and to call her PCP.  Husband is hospitalized with COVID.  Pt. reported cough x 4-5 days, headache, dry mouth, and nausea.  Denied fever or shortness of breath.  Asking if she can have something for nausea?  Taking sips of water and Gatorade.    Transferred to the office to schedule a Virtual appt. Today with PCP.  Pt. Agreed with plan.       Reason for Disposition . [1] Chest pain lasts > 5 minutes AND [2] occurred in past 3 days (72 hours)    C/o chest tightness that started during the night; c/o nausea ; EMS evaluated today and advised that vital signs stable, and that she poss. Has COVID.  Was advised to call her PCP.  Answer Assessment - Initial Assessment Questions 1. LOCATION: "Where does it hurt?"       Center of chest 2. RADIATION: "Does the pain go anywhere else?" (e.g., into neck, jaw, arms, back)     Through to the back  3. ONSET: "When did the chest pain begin?" (Minutes, hours or days)      During the night 4. PATTERN "Does the pain come and go, or has it been constant since it started?"  "Does it get worse with exertion?"      Constant  5. DURATION: "How long does it last" (e.g., seconds, minutes, hours)     Started during the night 6. SEVERITY: "How bad is the pain?"  (e.g., Scale 1-10; mild, moderate, or severe)    - MILD (1-3): doesn't interfere with normal activities     - MODERATE (4-7): interferes with normal activities or awakens from sleep    - SEVERE (8-10): excruciating pain, unable to do any normal activities       3-4/10  7. CARDIAC RISK FACTORS: "Do you have any history of heart problems or risk factors for heart disease?" (e.g.,  angina, prior heart attack; diabetes, high blood pressure, high cholesterol, smoker, or strong family history of heart disease)     Denied heart or bp hx; nonsmoker; no pulm. Symptoms; father with heart disease 8. PULMONARY RISK FACTORS: "Do you have any history of lung disease?"  (e.g., blood clots in lung, asthma, emphysema, birth control pills)     N/a  9. CAUSE: "What do you think is causing the chest pain?"     Unknown 10. OTHER SYMPTOMS: "Do you have any other symptoms?" (e.g., dizziness, nausea, vomiting, sweating, fever, difficulty breathing, cough)      Cough for 4-5 days, c/o nausea; denied fever or shortness of breath; c/o headache; mouth very dry; drank bottle of H2O past 24 hrs.   11. PREGNANCY: "Is there any chance you are pregnant?" "When was your last menstrual period?"       N/a  Protocols used: CHEST PAIN-A-AH

## 2019-10-24 NOTE — Telephone Encounter (Signed)
Message Routed to PCP CMA 

## 2019-10-24 NOTE — Progress Notes (Signed)
This visit type was conducted due to national recommendations for restrictions regarding the COVID-19 pandemic in an effort to limit this patient's exposure and mitigate transmission in our community.   Virtual Visit via Video Note  I connected with [name] on 10/24/19 at 11:15 AM EST by a video enabled telemedicine application and verified that I am speaking with the correct person using two identifiers.  Location patient: home Location provider:work or home office Persons participating in the virtual visit: patient, provider  I discussed the limitations of evaluation and management by telemedicine and the availability of in person appointments. The patient expressed understanding and agreed to proceed.   HPI:  [name] called with some nausea, cough, increased malaise.  She states her husband was diagnosed with Covid last Thursday.  She was tested on the night the negative.  She went yesterday to [facility] and was repeat tested and that is still pending.  She has had some nausea without vomiting.  She denies any diarrhea.  No dyspnea.  She has had some cough but the cough is actually slightly better than it was over the weekend.  No significant body aches.  No sore throat.  No abdominal pain.  She was recently placed on doxycycline and prednisone for some sinus symptoms.  She took her self off that yesterday.  She actually called EMS earlier this morning and they came out and she had pulse oximetry reportedly 97%.  Her vital signs were stable.  She complains of some diffuse chest fullness in her upper chest area.  This is not exertional and nonintermittent and constant for the past couple days.  No dysphagia.  Very poor appetite.  Is keeping some fluids down.   ROS: See pertinent positives and negatives per HPI.  Past Medical History:  Diagnosis Date  . 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 Varicose veins     Past Surgical History:  Procedure Laterality Date  . ABDOMINAL HYSTERECTOMY    . APPENDECTOMY    . COLECTOMY      Family History  Problem Relation Age of Onset  . Thyroid disease Mother   . Arthritis Mother   . Cancer Father        lung ca , cad, smoker  . Heart disease Father     SOCIAL HX: Non-smoker   Current Outpatient Medications:  .  cholecalciferol (VITAMIN D) 1000 units tablet, Take 2,000 Units by mouth daily., Disp: , Rfl:  .  doxycycline (VIBRA-TABS) 100 MG tablet, Take 1 tablet (100 mg total) by mouth 2 (two) times daily for 10 days., Disp: 20 tablet, Rfl: 0 .  fluticasone (FLONASE) 50 MCG/ACT nasal spray, Place 1 spray into both nostrils 2 (two) times daily., Disp: 16 g, Rfl: 3 .  ondansetron (ZOFRAN ODT) 4 MG disintegrating tablet, Take 1 tablet (4 mg total) by mouth every 8 (eight) hours as needed for nausea or vomiting., Disp: 15 tablet, Rfl: 0  EXAM:  VITALS per patient if applicable:  GENERAL: alert, oriented, appears well and in no acute distress  HEENT: atraumatic, conjunttiva clear, no obvious abnormalities on inspection of external nose and ears  NECK: normal movements of the head and neck  LUNGS: on inspection no signs of respiratory distress, breathing rate appears normal, no obvious gross SOB, gasping or wheezing  CV: no obvious cyanosis  MS: moves all visible extremities without noticeable abnormality  PSYCH/NEURO: pleasant and cooperative,  no obvious depression or anxiety, speech and thought processing grossly intact  ASSESSMENT AND PLAN:  Discussed the following assessment and plan:  Patient presents with several day history of increased malaise, recent cough, nausea without vomiting.  Husband currently has Covid infection.  Fairly high suspicion that she probably does as well.  She does describe some chest symptoms but these are been relatively constant.  -We recommended Zofran 4  mg ODT 1 every 8 hours as needed for nausea and vomiting  -Increase fluids and especially try to get some electrolyte replacement.  -We recommend she call 911 or go to ER for any progressive chest symptoms, increased shortness of breath, or other concerns of worsening symptoms  -She had repeat Covid testing as above and that is still pending     I discussed the assessment and treatment plan with the patient. The patient was provided an opportunity to ask questions and all were answered. The patient agreed with the plan and demonstrated an understanding of the instructions.   The patient was advised to call back or seek an in-person evaluation if the symptoms worsen or if the condition fails to improve as anticipated.    Carolann Littler, MD

## 2019-10-25 ENCOUNTER — Other Ambulatory Visit: Payer: Self-pay | Admitting: Nurse Practitioner

## 2019-10-25 ENCOUNTER — Telehealth: Payer: Self-pay | Admitting: Nurse Practitioner

## 2019-10-25 ENCOUNTER — Ambulatory Visit: Payer: PPO

## 2019-10-25 DIAGNOSIS — U071 COVID-19: Secondary | ICD-10-CM

## 2019-10-25 NOTE — Telephone Encounter (Signed)
Called to discuss with [name] about Covid symptoms and the use of bamlanivimab, a monoclonal antibody infusion for those with mild to moderate Covid symptoms and at a high risk of hospitalization.     Pt is qualified for this infusion at the [facility] infusion center due to co-morbid conditions and/or a member of an at-risk group. Patient requesting to be scheduled for infusion. She is experiencing nausea, cough, fatigue, and body aches.   Symptoms tier reviewed as well as criteria for ending isolation.  Symptoms reviewed that would warrant ED/Hospital evaluation. Preventative practices reviewed. Patient verbalized understanding.   At patient's request she has been scheduled for Monday 10/29/19 @ 10:30 am. She verbalizes understanding of treatment and appointment details.      Patient Active Problem List   Diagnosis Date Noted  . Acute medial meniscus tear of right knee 07/31/2019  . Raynaud phenomenon 10/20/2018  . Trigger thumb of left hand 06/14/2018  . Arthritis of carpometacarpal (CMC) joint of both thumbs 06/14/2018  . 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 Hot flash not due to menopause 04/04/2018  . Acute medial meniscal tear, left, initial encounter 10/14/2017  . Counseling for estrogen replacement therapy 12/23/2015  . Environmental and seasonal allergies 12/23/2015  . H/O right hemicolectomy 12/23/2015  . Varicose veins of leg with complications [id number]  . 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 Cousar

## 2019-10-25 NOTE — Telephone Encounter (Signed)
She was evaluated in the ER on 10/23/18 for COVID 19 infection. Jamita Mckelvin Martinique, MD

## 2019-10-25 NOTE — Progress Notes (Signed)
  I connected by phone with [name] on 10/25/2019 at 12:43 PM to discuss the potential use of an new treatment for mild to moderate COVID-19 viral infection in non-hospitalized patients.  This patient is a 74 y.o. female that meets the FDA criteria for Emergency Use Authorization of bamlanivimab or casirivimab\imdevimab.  Has a (+) direct SARS-CoV-2 viral test result  Has mild or moderate COVID-19   Is ? 72 years of age and weighs ? 40 kg  Is NOT hospitalized due to COVID-19  Is NOT requiring oxygen therapy or requiring an increase in baseline oxygen flow rate due to COVID-19  Is within 10 days of symptom onset  Has at least one of the high risk factor(s) for progression to severe COVID-19 and/or hospitalization as defined in EUA.  Specific high risk criteria : >/= 72 yo   I have spoken and communicated the following to the patient or parent/caregiver:  1. FDA has authorized the emergency use of bamlanivimab and casirivimab\imdevimab for the treatment of mild to moderate COVID-19 in adults and pediatric patients with positive results of direct SARS-CoV-2 viral testing who are 69 years of age and older weighing at least 40 kg, and who are at high risk for progressing to severe COVID-19 and/or hospitalization.  2. The significant known and potential risks and benefits of bamlanivimab and casirivimab\imdevimab, and the extent to which such potential risks and benefits are unknown.  3. Information on available alternative treatments and the risks and benefits of those alternatives, including clinical trials.  4. Patients treated with bamlanivimab and casirivimab\imdevimab should continue to self-isolate and use infection control measures (e.g., wear mask, isolate, social distance, avoid sharing personal items, clean and disinfect "high touch" surfaces, and frequent handwashing) according to CDC guidelines.   5. The patient or parent/caregiver has the option to accept or refuse  bamlanivimab or casirivimab\imdevimab .  After reviewing this information with the patient, The patient agreed to proceed with receiving the bamlanimivab infusion and will be provided a copy of the Fact sheet prior to receiving the infusion.   [name] 10/25/2019 12:43 PM

## 2019-10-26 ENCOUNTER — Other Ambulatory Visit: Payer: PPO

## 2019-10-29 ENCOUNTER — Ambulatory Visit (HOSPITAL_COMMUNITY): Payer: PPO

## 2019-11-02 DIAGNOSIS — U071 COVID-19: Secondary | ICD-10-CM | POA: Diagnosis not present

## 2019-11-02 DIAGNOSIS — Z20828 Contact with and (suspected) exposure to other viral communicable diseases: Secondary | ICD-10-CM | POA: Diagnosis not present

## 2019-11-09 ENCOUNTER — Ambulatory Visit: Payer: PPO

## 2019-11-09 ENCOUNTER — Ambulatory Visit
Admission: RE | Admit: 2019-11-09 | Discharge: 2019-11-09 | Disposition: A | Discharge status: Home / Self Care | Payer: PPO | Source: Ambulatory Visit | Attending: Family Medicine | Admitting: Family Medicine

## 2019-11-09 ENCOUNTER — Other Ambulatory Visit: Payer: Self-pay

## 2019-11-09 DIAGNOSIS — R922 Inconclusive mammogram: Secondary | ICD-10-CM | POA: Diagnosis not present

## 2019-11-09 DIAGNOSIS — R928 Other abnormal and inconclusive findings on diagnostic imaging of breast: Secondary | ICD-10-CM

## 2019-12-24 DIAGNOSIS — I70219 Atherosclerosis of native arteries of extremities with intermittent claudication, unspecified extremity: Secondary | ICD-10-CM | POA: Diagnosis not present

## 2019-12-24 DIAGNOSIS — Z131 Encounter for screening for diabetes mellitus: Secondary | ICD-10-CM | POA: Diagnosis not present

## 2019-12-24 DIAGNOSIS — H9113 Presbycusis, bilateral: Secondary | ICD-10-CM | POA: Diagnosis not present

## 2019-12-24 DIAGNOSIS — E785 Hyperlipidemia, unspecified: Secondary | ICD-10-CM | POA: Diagnosis not present

## 2019-12-24 DIAGNOSIS — R42 Dizziness and giddiness: Secondary | ICD-10-CM | POA: Diagnosis not present

## 2019-12-24 DIAGNOSIS — D485 Neoplasm of uncertain behavior of skin: Secondary | ICD-10-CM | POA: Diagnosis not present

## 2019-12-24 DIAGNOSIS — R002 Palpitations: Secondary | ICD-10-CM | POA: Diagnosis not present

## 2019-12-24 DIAGNOSIS — Z0000 Encounter for general adult medical examination without abnormal findings: Secondary | ICD-10-CM | POA: Diagnosis not present

## 2019-12-24 DIAGNOSIS — Z1331 Encounter for screening for depression: Secondary | ICD-10-CM | POA: Diagnosis not present

## 2019-12-24 DIAGNOSIS — R5383 Other fatigue: Secondary | ICD-10-CM | POA: Diagnosis not present

## 2019-12-24 DIAGNOSIS — Z1389 Encounter for screening for other disorder: Secondary | ICD-10-CM | POA: Diagnosis not present

## 2019-12-24 DIAGNOSIS — E039 Hypothyroidism, unspecified: Secondary | ICD-10-CM | POA: Diagnosis not present

## 2019-12-24 DIAGNOSIS — E7800 Pure hypercholesterolemia, unspecified: Secondary | ICD-10-CM | POA: Diagnosis not present

## 2019-12-26 ENCOUNTER — Other Ambulatory Visit: Payer: Self-pay | Admitting: Internal Medicine

## 2019-12-26 DIAGNOSIS — E2839 Other primary ovarian failure: Secondary | ICD-10-CM

## 2019-12-27 DIAGNOSIS — M810 Age-related osteoporosis without current pathological fracture: Secondary | ICD-10-CM | POA: Diagnosis not present

## 2019-12-27 DIAGNOSIS — R1032 Left lower quadrant pain: Secondary | ICD-10-CM | POA: Diagnosis not present

## 2020-01-10 ENCOUNTER — Other Ambulatory Visit: Payer: Self-pay | Admitting: Family Medicine

## 2020-01-10 DIAGNOSIS — J309 Allergic rhinitis, unspecified: Secondary | ICD-10-CM

## 2020-03-04 ENCOUNTER — Other Ambulatory Visit: Payer: PPO

## 2020-03-11 ENCOUNTER — Other Ambulatory Visit: Payer: Self-pay

## 2020-03-11 ENCOUNTER — Ambulatory Visit
Admission: RE | Admit: 2020-03-11 | Discharge: 2020-03-11 | Disposition: A | Discharge status: Home / Self Care | Payer: PPO | Source: Ambulatory Visit | Attending: Internal Medicine | Admitting: Internal Medicine

## 2020-03-11 DIAGNOSIS — Z780 Asymptomatic menopausal state: Secondary | ICD-10-CM | POA: Diagnosis not present

## 2020-03-11 DIAGNOSIS — M810 Age-related osteoporosis without current pathological fracture: Secondary | ICD-10-CM | POA: Diagnosis not present

## 2020-03-11 DIAGNOSIS — M85852 Other specified disorders of bone density and structure, left thigh: Secondary | ICD-10-CM | POA: Diagnosis not present

## 2020-03-11 DIAGNOSIS — E2839 Other primary ovarian failure: Secondary | ICD-10-CM

## 2020-03-12 DIAGNOSIS — M810 Age-related osteoporosis without current pathological fracture: Secondary | ICD-10-CM | POA: Diagnosis not present

## 2020-03-13 DIAGNOSIS — M810 Age-related osteoporosis without current pathological fracture: Secondary | ICD-10-CM | POA: Diagnosis not present

## 2020-04-01 DIAGNOSIS — L814 Other melanin hyperpigmentation: Secondary | ICD-10-CM | POA: Diagnosis not present

## 2020-04-01 DIAGNOSIS — Z411 Encounter for cosmetic surgery: Secondary | ICD-10-CM | POA: Diagnosis not present

## 2020-04-01 DIAGNOSIS — L821 Other seborrheic keratosis: Secondary | ICD-10-CM | POA: Diagnosis not present

## 2020-04-23 ENCOUNTER — Ambulatory Visit: Payer: PPO

## 2020-04-23 ENCOUNTER — Other Ambulatory Visit: Payer: Self-pay

## 2020-04-25 DIAGNOSIS — I10 Essential (primary) hypertension: Secondary | ICD-10-CM | POA: Diagnosis not present

## 2020-04-25 DIAGNOSIS — I201 Angina pectoris with documented spasm: Secondary | ICD-10-CM | POA: Diagnosis not present

## 2020-05-05 DIAGNOSIS — D225 Melanocytic nevi of trunk: Secondary | ICD-10-CM | POA: Diagnosis not present

## 2020-05-05 DIAGNOSIS — L578 Other skin changes due to chronic exposure to nonionizing radiation: Secondary | ICD-10-CM | POA: Diagnosis not present

## 2020-05-05 DIAGNOSIS — L821 Other seborrheic keratosis: Secondary | ICD-10-CM | POA: Diagnosis not present

## 2020-05-05 DIAGNOSIS — L814 Other melanin hyperpigmentation: Secondary | ICD-10-CM | POA: Diagnosis not present

## 2020-06-24 ENCOUNTER — Encounter: Payer: Self-pay | Admitting: Family Medicine

## 2020-06-24 ENCOUNTER — Other Ambulatory Visit: Payer: Self-pay

## 2020-06-24 ENCOUNTER — Ambulatory Visit (INDEPENDENT_AMBULATORY_CARE_PROVIDER_SITE_OTHER): Payer: PPO | Admitting: Family Medicine

## 2020-06-24 VITALS — BP 120/70 | HR 60 | Temp 98.1°F | Resp 16 | Ht 65.0 in | Wt 126.0 lb

## 2020-06-24 DIAGNOSIS — I491 Atrial premature depolarization: Secondary | ICD-10-CM | POA: Diagnosis not present

## 2020-06-24 DIAGNOSIS — E039 Hypothyroidism, unspecified: Secondary | ICD-10-CM

## 2020-06-24 DIAGNOSIS — R200 Anesthesia of skin: Secondary | ICD-10-CM | POA: Diagnosis not present

## 2020-06-24 DIAGNOSIS — M816 Localized osteoporosis [Lequesne]: Secondary | ICD-10-CM | POA: Diagnosis not present

## 2020-06-24 DIAGNOSIS — E785 Hyperlipidemia, unspecified: Secondary | ICD-10-CM

## 2020-06-24 DIAGNOSIS — E038 Other specified hypothyroidism: Secondary | ICD-10-CM

## 2020-06-24 DIAGNOSIS — Z1159 Encounter for screening for other viral diseases: Secondary | ICD-10-CM

## 2020-06-24 DIAGNOSIS — Z1211 Encounter for screening for malignant neoplasm of colon: Secondary | ICD-10-CM | POA: Diagnosis not present

## 2020-06-24 MED ORDER — ALENDRONATE SODIUM 70 MG PO TABS: 70.0000 mg | ORAL_TABLET | ORAL | 3 refills | Status: DC

## 2020-06-24 NOTE — Progress Notes (Addendum)
HPI: [name] is a 74 y.o. female, who is here today for chronic disease management. She had AWV and CPE with another provider in 12/2019.  Regular exercise 3 or more time per week: Plays tennis once per week, walks her dog. Following a healthy diet: Yes. She lives with her husband, who has hx of alzheimer's disease.  Independent ADL's and IADL's. No falls in the past year and denies depression symptoms.  Functional Status Survey: Is the patient deaf or have difficulty hearing?: No Does the patient have difficulty seeing, even when wearing glasses/contacts?: No Does the patient have difficulty concentrating, remembering, or making decisions?: No Does the patient have difficulty walking or climbing stairs?: No Does the patient have difficulty dressing or bathing?: No Does the patient have difficulty doing errands alone such as visiting a doctor's office or shopping?: No  Fall Risk  06/24/2020 05/02/2019 01/28/2017 03/28/2015  Falls in the past year? 0 (No Data) No No  Comment - Emmi Telephone Survey: data to providers prior to load - -  Number falls in past yr: 0 (No Data) - -  Comment - Emmi Telephone Survey Actual Response =  - -  Injury with Fall? 0 - - -  Follow up Education provided - - -   Providers she sees regularly: Eye care provider: Dr Georgina Peer, next visit 07/2020.  Depression screen [facility] 2/9 06/24/2020  Decreased Interest 0  Down, Depressed, Hopeless 0  PHQ - 2 Score 0    Mini-Cog - 06/26/20 2322    Normal clock drawing test? yes    How many words correct? 3           Hearing Screening   125Hz 250Hz 500Hz 1000Hz 2000Hz 3000Hz 4000Hz 6000Hz 8000Hz  Right ear:           Left ear:           Vision Screening Comments: Refused, she has an appt with eye care provider in 07/2020.  Chronic medical problems: Allergic rhinitis,HLD,osteoporosis, hypothyroidism,and OA among some.   Immunization History  Administered Date(s) Administered  . Influenza,  High Dose Seasonal PF 06/22/2017, 06/25/2018, 07/02/2019  . Influenza-Unspecified 07/04/2014, 06/25/2015, 07/04/2016  . Moderna SARS-COVID-2 Vaccination 11/15/2019  . Pneumococcal Conjugate-13 03/28/2015  . Td 11/04/2009   Mammogram: 11/09/2019. Colon cancer screening: Cologuard negative on 05/27/17. DEXA: 03/11/20 osteoporosis. She is not on pharmacologic treatment. Ca++ supplementation caused GI issues.  Hep C screening: Not sure.  Concerns today: Left hand numbness for a few months, shaking helps.  Negative for weakness,edema,erythema,or rash. Stable. She is left handed.  Subclinical hypothyroidism: last TSH in normal range , 2.7 in 04/2018.  HLD: She is on non pharmacologic treatment.  Component     Latest Ref Rng & Units 04/05/2018  Cholesterol     <200 mg/dL 212 (H)  Triglycerides     <150 mg/dL 84.0  HDL Cholesterol     > OR = 50 mg/dL 75.80  VLDL     0.0 - 40.0 mg/dL 16.8  LDL (calc)     0 - 99 mg/dL 119 (H)  Total CHOL/HDL Ratio     <5.0 (calc) 3  NonHDL      135.96    Review of Systems  Constitutional: Negative for activity change, appetite change, fatigue and fever.  HENT: Negative for mouth sores, nosebleeds and sore throat.   Eyes: Negative for redness and visual disturbance.  Respiratory: Negative for cough, shortness of breath and wheezing.   Cardiovascular: Negative  for chest pain, palpitations and leg swelling.  Gastrointestinal: Negative for abdominal pain, nausea and vomiting.       Negative for changes in bowel habits.  Genitourinary: Negative for decreased urine volume, dysuria and hematuria.  Musculoskeletal: Positive for arthralgias. Negative for gait problem.  Skin: Negative for rash and wound.  Neurological: Negative for syncope, weakness and headaches.  Psychiatric/Behavioral: Negative for confusion. The patient is not nervous/anxious.   All other systems reviewed and are negative.   Current Outpatient Medications on File Prior to Visit    Medication Sig Dispense Refill  . cholecalciferol (VITAMIN D) 1000 units tablet Take 2,000 Units by mouth daily.    . fluticasone (FLONASE) 50 MCG/ACT nasal spray PLACE 1 SPRAY INTO BOTH NOSTRILS 2 (TWO) TIMES DAILY 48 mL 1  . ondansetron (ZOFRAN ODT) 4 MG disintegrating tablet Take 1 tablet (4 mg total) by mouth every 8 (eight) hours as needed for nausea or vomiting. 15 tablet 0   No current facility-administered medications on file prior to visit.   Past Medical History:  Diagnosis Date  . 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 Varicose veins     Past Surgical History:  Procedure Laterality Date  . ABDOMINAL HYSTERECTOMY    . APPENDECTOMY    . COLECTOMY      Allergies  Allergen Reactions  . Penicillins Rash    Family History  Problem Relation Age of Onset  . Thyroid disease Mother   . Arthritis Mother   . Cancer Father        lung ca , cad, smoker  . Heart disease Father     Social History   Socioeconomic History  . Marital status: Married    Spouse name: Not on file  . Number of children: 1  . Years of education: Not on file  . Highest education level: Not on file  Occupational History  . Occupation: Warehouse manager  Tobacco Use  . Smoking status: Never Smoker  . Smokeless tobacco: Never Used  Substance and Sexual Activity  . Alcohol use: Yes    Alcohol/week: 0.0 standard drinks  . Drug use: No  . Sexual activity: Not on file  Other Topics Concern  . Not on file  Social History Narrative  . Not on file   Social Determinants of Health   Financial Resource Strain:   . Difficulty of Paying Living Expenses: Not on file  Food Insecurity:   . Worried About Charity fundraiser in the Last Year: Not on file  . Ran Out of Food in the Last Year: Not on file  Transportation Needs:   . Lack of Transportation (Medical): Not on file  . Lack of  Transportation (Non-Medical): Not on file  Physical Activity:   . Days of Exercise per Week: Not on file  . Minutes of Exercise per Session: Not on file  Stress:   . Feeling of Stress : Not on file  Social Connections:   . Frequency of Communication with Friends and Family: Not on file  . Frequency of Social Gatherings with Friends and Family: Not on file  . Attends Religious Services: Not on file  . Active Member of Clubs or Organizations: Not on file  . Attends Archivist Meetings: Not on file  . Marital Status: Not on file    Vitals:   06/24/20 0853  BP: 120/70  Pulse: 60  Resp: 16  Temp: 98.1 F (36.7 C)  SpO2: 98%   Body mass index is 20.97 kg/m.  Wt Readings from Last 3 Encounters:  06/24/20 126 lb (57.2 kg)  07/31/19 121 lb (54.9 kg)  10/20/18 128 lb 2 oz (58.1 kg)    Physical Exam Vitals and nursing note reviewed.  Constitutional:      General: She is not in acute distress.    Appearance: She is well-developed and normal weight.  HENT:     Head: Normocephalic and atraumatic.     Right Ear: Hearing, tympanic membrane, ear canal and external ear normal.     Left Ear: Hearing, tympanic membrane, ear canal and external ear normal.     Mouth/Throat:     Mouth: Mucous membranes are moist.     Pharynx: Oropharynx is clear. Uvula midline.  Eyes:     Extraocular Movements: Extraocular movements intact.     Conjunctiva/sclera: Conjunctivae normal.     Pupils: Pupils are equal, round, and reactive to light.  Neck:     Trachea: No tracheal deviation.  Cardiovascular:     Rate and Rhythm: Normal rate. Rhythm irregular. Occasional extrasystoles are present.    Pulses:          Dorsalis pedis pulses are 2+ on the right side and 2+ on the left side.     Heart sounds: No murmur heard.   Pulmonary:     Effort: Pulmonary effort is normal. No respiratory distress.     Breath sounds: Normal breath sounds.  Abdominal:     Palpations: Abdomen is soft. There is  no hepatomegaly or mass.     Tenderness: There is no abdominal tenderness.  Musculoskeletal:     Comments: No signs of synovitis appreciated.  Lymphadenopathy:     Cervical: No cervical adenopathy.  Skin:    General: Skin is warm.     Findings: No erythema or rash.  Neurological:     Mental Status: She is alert and oriented to person, place, and time.     Cranial Nerves: No cranial nerve deficit.     Coordination: Coordination normal.     Gait: Gait normal.     Deep Tendon Reflexes:     Reflex Scores:      Bicep reflexes are 2+ on the right side and 2+ on the left side.      Patellar reflexes are 2+ on the right side and 2+ on the left side. Psychiatric:        Mood and Affect: Mood and affect normal.        Speech: Speech normal.     Comments: Well groomed, good eye contact.   ASSESSMENT AND PLAN:  [name] was here today for chronic disease management  Orders Placed This Encounter  Procedures  . TSH  . Lipid panel  . BASIC METABOLIC PANEL WITH GFR   Lab Results  Component Value Date   TSH 2.91 06/24/2020   Lab Results  Component Value Date   CREATININE 0.91 06/24/2020   BUN 18 06/24/2020   NA 142 06/24/2020   K 4.2 06/24/2020   CL 106 06/24/2020   CO2 26 06/24/2020   Lab Results  Component Value Date   CHOL 197 06/24/2020   HDL 64 06/24/2020   LDLCALC 113 (H) 06/24/2020   LDLDIRECT 114.5 11/14/2012   TRIG 101 06/24/2020   CHOLHDL 3.1 06/24/2020   Hyperlipidemia, unspecified hyperlipidemia type Continue non pharmacologic treatment. Further recommendations according to lipid panel results.  Localized osteoporosis without current pathological fracture We discussed treatment options. She agrees with trying Fosamax 70 mg weekly. Fall precautions and continue regular physical activity.  -     alendronate (FOSAMAX) 70 MG tablet; Take 1 tablet (70 mg total) by mouth every 7 (seven) days. Take with a full glass of water on an empty  stomach.  Premature atrial contractions Asymptomatic. EKG in 2014 with PAC's. Instructed about warning signs.  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 Recommend night time splint. If not better ortho/sport medicine evaluation needs to be considered.  Return in about 1 year (around 06/24/2021) for awv/cpe.  Charles Niese G. Martinique, MD  [facility]. [place] office.   Ms. Weipert , Thank you for taking time to come for your Medicare Wellness Visit. I appreciate your ongoing commitment to your health goals. Please review the following plan we discussed and let me know if I can assist you in the future.   These are the goals we discussed: Goals   None     This is a list of the screening recommended for you and due dates:  Health Maintenance  Topic Date Due  .  Hepatitis C: One time screening is recommended by Center for Disease Control  (CDC) for  adults born from [birth year] through 1963.   Never done  . Pneumonia vaccines (2 of 2 - PPSV23) 03/27/2016  . Colon Cancer Screening  10/04/2016  . COVID-19 Vaccine (2 - Moderna 2-dose series) 12/13/2019  . Flu Shot  05/04/2020  . Tetanus Vaccine  10/11/2020*  . Mammogram  10/08/2020  . DEXA scan (bone density measurement)  Completed  *Topic was postponed. The date shown is not the original due date.   A few things to remember from today's visit:   Subclinical hypothyroidism - Plan: TSH  Hyperlipidemia, unspecified hyperlipidemia type - Plan: Lipid panel, BASIC METABOLIC PANEL WITH GFR  Localized osteoporosis without current pathological fracture  Premature atrial contractions  Routine general medical examination at a health care facility  Medicare annual wellness visit, subsequent  A few tips:  -As we age balance is not as good as it was, so there is a higher risks for falls. Please remove small rugs and furniture that is "in your way" and could increase the risk of falls. Stretching exercises may help with  fall prevention: Yoga and Tai Chi are some examples. Low impact exercise is better, so you are not very achy the next day.  -Sun screen and avoidance of direct sun light recommended. Caution with dehydration, if working outdoors be sure to drink enough fluids.  - Some medications are not safe as we age, increases the risk of side effects and can potentially interact with other medication you are also taken;  including some of over the counter medications. Be sure to let me know when you start a new medication even if it is a dietary/vitamin supplement.   -Healthy diet low in red meet/animal fat and sugar + regular physical activity is recommended.

## 2020-06-24 NOTE — Patient Instructions (Addendum)
  Ms. [name] , Thank you for taking time to come for your Medicare Wellness Visit. I appreciate your ongoing commitment to your health goals. Please review the following plan we discussed and let me know if I can assist you in the future.   These are the goals we discussed: Goals   None     This is a list of the screening recommended for you and due dates:  Health Maintenance  Topic Date Due  .  Hepatitis C: One time screening is recommended by Center for Disease Control  (CDC) for  adults born from [birth year] through 1963.   Never done  . Pneumonia vaccines (2 of 2 - PPSV23) 03/27/2016  . Colon Cancer Screening  10/04/2016  . COVID-19 Vaccine (2 - Moderna 2-dose series) 12/13/2019  . Flu Shot  05/04/2020  . Tetanus Vaccine  10/11/2020*  . Mammogram  10/08/2020  . DEXA scan (bone density measurement)  Completed  *Topic was postponed. The date shown is not the original due date.   A few things to remember from today's visit:   Subclinical hypothyroidism - Plan: TSH  Hyperlipidemia, unspecified hyperlipidemia type - Plan: Lipid panel, BASIC METABOLIC PANEL WITH GFR  Localized osteoporosis without current pathological fracture  Premature atrial contractions  Routine general medical examination at a health care facility  Medicare annual wellness visit, subsequent  A few tips:  -As we age balance is not as good as it was, so there is a higher risks for falls. Please remove small rugs and furniture that is "in your way" and could increase the risk of falls. Stretching exercises may help with fall prevention: Yoga and Tai Chi are some examples. Low impact exercise is better, so you are not very achy the next day.  -Sun screen and avoidance of direct sun light recommended. Caution with dehydration, if working outdoors be sure to drink enough fluids.  - Some medications are not safe as we age, increases the risk of side effects and can potentially interact with other medication you  are also taken;  including some of over the counter medications. Be sure to let me know when you start a new medication even if it is a dietary/vitamin supplement.   -Healthy diet low in red meet/animal fat and sugar + regular physical activity is recommended.

## 2020-06-25 LAB — BASIC METABOLIC PANEL WITH GFR
BUN: 18 mg/dL (ref 7–25)
CO2: 26 mmol/L (ref 20–32)
Calcium: 9.2 mg/dL (ref 8.6–10.4)
Chloride: 106 mmol/L (ref 98–110)
Creat: 0.91 mg/dL (ref 0.60–0.93)
GFR, Est African American: 73 mL/min/{1.73_m2} (ref >=60)
GFR, Est Non African American: 63 mL/min/{1.73_m2} (ref >=60)
Glucose, Bld: 67 mg/dL (ref 65–99)
Potassium: 4.2 mmol/L (ref 3.5–5.3)
Sodium: 142 mmol/L (ref 135–146)

## 2020-06-25 LAB — LIPID PANEL
Cholesterol: 197 mg/dL (ref <200)
HDL: 64 mg/dL (ref >=50)
LDL Cholesterol (Calc): 113 mg/dL (calc) — ABNORMAL HIGH
Non-HDL Cholesterol (Calc): 133 mg/dL (calc) — ABNORMAL HIGH (ref <130)
Total CHOL/HDL Ratio: 3.1 (calc) (ref <5.0)
Triglycerides: 101 mg/dL (ref <150)

## 2020-06-25 LAB — TSH: TSH: 2.91 mIU/L (ref 0.40–4.50)

## 2020-06-26 ENCOUNTER — Encounter: Payer: Self-pay | Admitting: Family Medicine

## 2020-07-04 NOTE — Addendum Note (Signed)
Addended by: Martinique, Genavie Boettger G on: 07/04/2020 04:57 PM   Modules accepted: Level of Service

## 2020-07-08 DIAGNOSIS — L720 Epidermal cyst: Secondary | ICD-10-CM | POA: Diagnosis not present

## 2020-08-04 DIAGNOSIS — I83813 Varicose veins of bilateral lower extremities with pain: Secondary | ICD-10-CM | POA: Diagnosis not present

## 2020-08-04 DIAGNOSIS — I8312 Varicose veins of left lower extremity with inflammation: Secondary | ICD-10-CM | POA: Diagnosis not present

## 2020-08-04 DIAGNOSIS — I8311 Varicose veins of right lower extremity with inflammation: Secondary | ICD-10-CM | POA: Diagnosis not present

## 2020-10-08 DIAGNOSIS — I8312 Varicose veins of left lower extremity with inflammation: Secondary | ICD-10-CM | POA: Diagnosis not present

## 2020-10-08 DIAGNOSIS — I8311 Varicose veins of right lower extremity with inflammation: Secondary | ICD-10-CM | POA: Diagnosis not present

## 2020-10-16 DIAGNOSIS — I83813 Varicose veins of bilateral lower extremities with pain: Secondary | ICD-10-CM | POA: Diagnosis not present

## 2020-10-16 DIAGNOSIS — I8312 Varicose veins of left lower extremity with inflammation: Secondary | ICD-10-CM | POA: Diagnosis not present

## 2020-10-16 DIAGNOSIS — I8311 Varicose veins of right lower extremity with inflammation: Secondary | ICD-10-CM | POA: Diagnosis not present

## 2020-10-22 DIAGNOSIS — R059 Cough, unspecified: Secondary | ICD-10-CM | POA: Diagnosis not present

## 2020-10-22 DIAGNOSIS — J209 Acute bronchitis, unspecified: Secondary | ICD-10-CM | POA: Diagnosis not present

## 2020-10-24 DIAGNOSIS — J209 Acute bronchitis, unspecified: Secondary | ICD-10-CM | POA: Diagnosis not present

## 2020-11-15 DIAGNOSIS — F419 Anxiety disorder, unspecified: Secondary | ICD-10-CM | POA: Diagnosis not present

## 2020-11-21 DIAGNOSIS — F419 Anxiety disorder, unspecified: Secondary | ICD-10-CM | POA: Diagnosis not present

## 2020-11-26 DIAGNOSIS — F419 Anxiety disorder, unspecified: Secondary | ICD-10-CM | POA: Diagnosis not present

## 2020-12-04 DIAGNOSIS — F419 Anxiety disorder, unspecified: Secondary | ICD-10-CM | POA: Diagnosis not present

## 2021-01-01 DIAGNOSIS — F419 Anxiety disorder, unspecified: Secondary | ICD-10-CM | POA: Diagnosis not present

## 2021-03-17 ENCOUNTER — Other Ambulatory Visit: Payer: Self-pay | Admitting: Family Medicine

## 2021-03-17 DIAGNOSIS — Z1231 Encounter for screening mammogram for malignant neoplasm of breast: Secondary | ICD-10-CM

## 2021-05-07 ENCOUNTER — Other Ambulatory Visit: Payer: Self-pay

## 2021-05-07 ENCOUNTER — Ambulatory Visit
Admission: RE | Admit: 2021-05-07 | Discharge: 2021-05-07 | Disposition: A | Discharge status: Home / Self Care | Payer: Medicare HMO | Source: Ambulatory Visit | Attending: Family Medicine | Admitting: Family Medicine

## 2021-05-07 DIAGNOSIS — Z1231 Encounter for screening mammogram for malignant neoplasm of breast: Secondary | ICD-10-CM | POA: Diagnosis not present

## 2021-05-26 DIAGNOSIS — I70219 Atherosclerosis of native arteries of extremities with intermittent claudication, unspecified extremity: Secondary | ICD-10-CM | POA: Diagnosis not present

## 2021-05-26 DIAGNOSIS — E785 Hyperlipidemia, unspecified: Secondary | ICD-10-CM | POA: Diagnosis not present

## 2021-05-26 DIAGNOSIS — R5383 Other fatigue: Secondary | ICD-10-CM | POA: Diagnosis not present

## 2021-05-26 DIAGNOSIS — Z79899 Other long term (current) drug therapy: Secondary | ICD-10-CM | POA: Diagnosis not present

## 2021-05-26 DIAGNOSIS — Z131 Encounter for screening for diabetes mellitus: Secondary | ICD-10-CM | POA: Diagnosis not present

## 2021-05-26 DIAGNOSIS — E039 Hypothyroidism, unspecified: Secondary | ICD-10-CM | POA: Diagnosis not present

## 2021-05-26 DIAGNOSIS — Z1331 Encounter for screening for depression: Secondary | ICD-10-CM | POA: Diagnosis not present

## 2021-05-26 DIAGNOSIS — Z1389 Encounter for screening for other disorder: Secondary | ICD-10-CM | POA: Diagnosis not present

## 2021-05-26 DIAGNOSIS — Z7289 Other problems related to lifestyle: Secondary | ICD-10-CM | POA: Diagnosis not present

## 2021-05-26 DIAGNOSIS — Z0000 Encounter for general adult medical examination without abnormal findings: Secondary | ICD-10-CM | POA: Diagnosis not present

## 2021-05-26 DIAGNOSIS — R002 Palpitations: Secondary | ICD-10-CM | POA: Diagnosis not present

## 2021-05-26 DIAGNOSIS — E7800 Pure hypercholesterolemia, unspecified: Secondary | ICD-10-CM | POA: Diagnosis not present

## 2021-05-26 DIAGNOSIS — I10 Essential (primary) hypertension: Secondary | ICD-10-CM | POA: Diagnosis not present

## 2021-06-19 DIAGNOSIS — J209 Acute bronchitis, unspecified: Secondary | ICD-10-CM | POA: Diagnosis not present

## 2021-06-20 DIAGNOSIS — R1032 Left lower quadrant pain: Secondary | ICD-10-CM | POA: Diagnosis not present

## 2021-06-21 DIAGNOSIS — R1032 Left lower quadrant pain: Secondary | ICD-10-CM | POA: Diagnosis not present

## 2021-07-03 DIAGNOSIS — R1032 Left lower quadrant pain: Secondary | ICD-10-CM | POA: Diagnosis not present

## 2021-07-06 DIAGNOSIS — R1032 Left lower quadrant pain: Secondary | ICD-10-CM | POA: Diagnosis not present

## 2021-07-26 IMAGING — MG MM DIGITAL DIAGNOSTIC UNILAT*L* W/ TOMO W/ CAD
6 series · 6 of 18 positions shown · non-contrast
Comparison: Previous exams including recent screening mammogram
dated 10/09/2019.

CLINICAL DATA: Patient returns today to evaluate a possible LEFT
breast asymmetry questioned on recent screening mammogram.

EXAM:
DIGITAL DIAGNOSTIC UNILATERAL LEFT MAMMOGRAM WITH CAD AND TOMO

[L ML synth-2D]
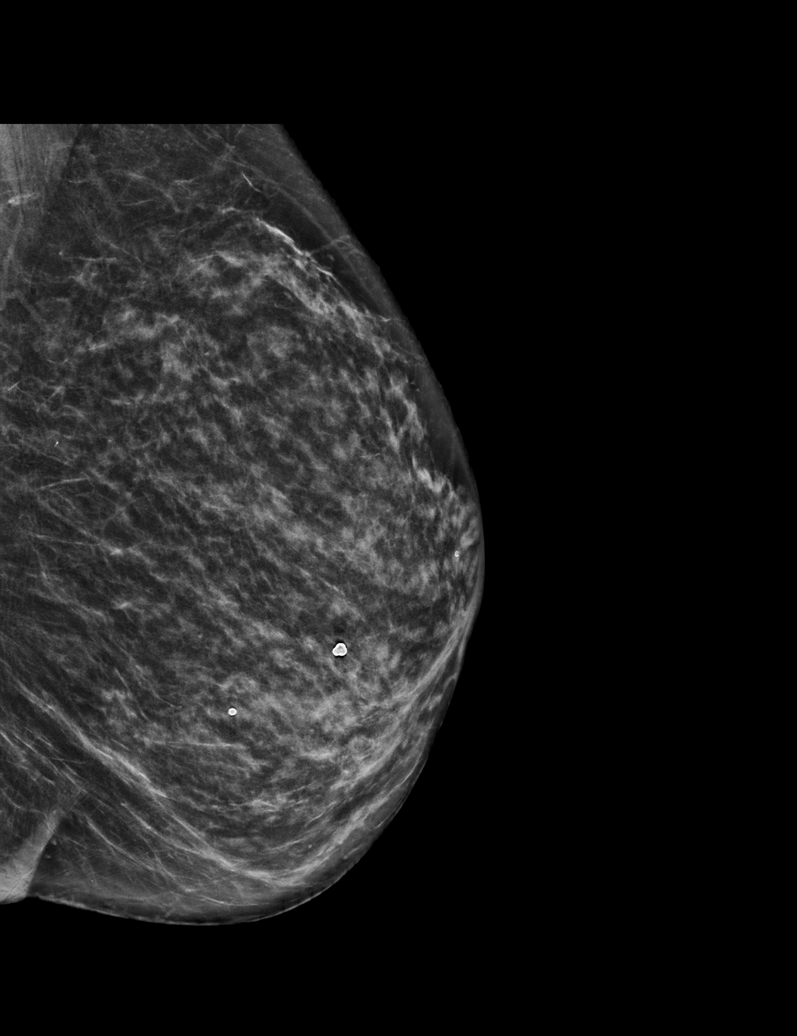

[L CC synth-2D (1 of 2)]
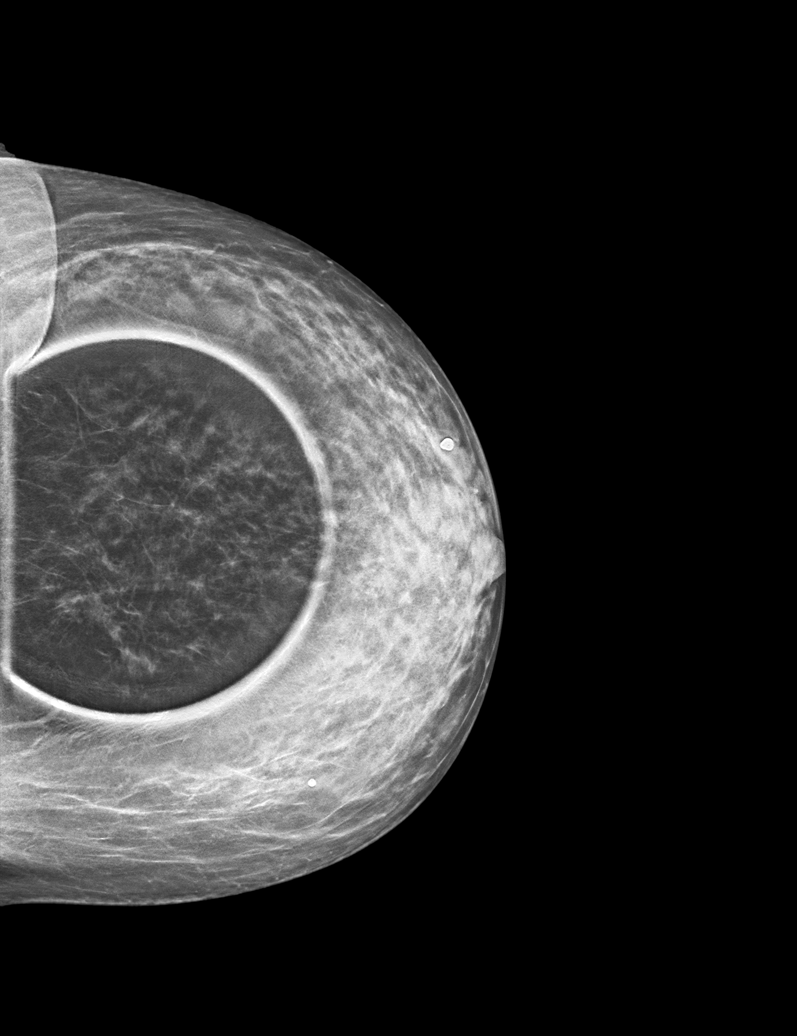

[L CC synth-2D (2 of 2)]
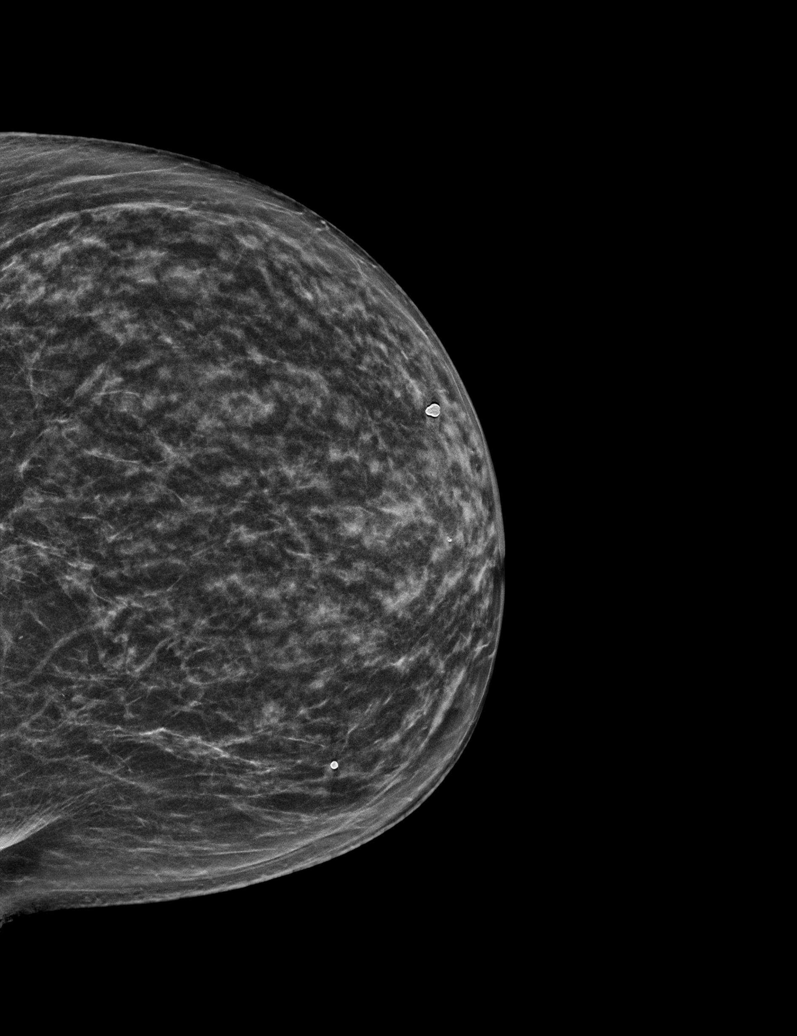

[L CC tomo (1 of 2) · tomo slice 27/54.0]
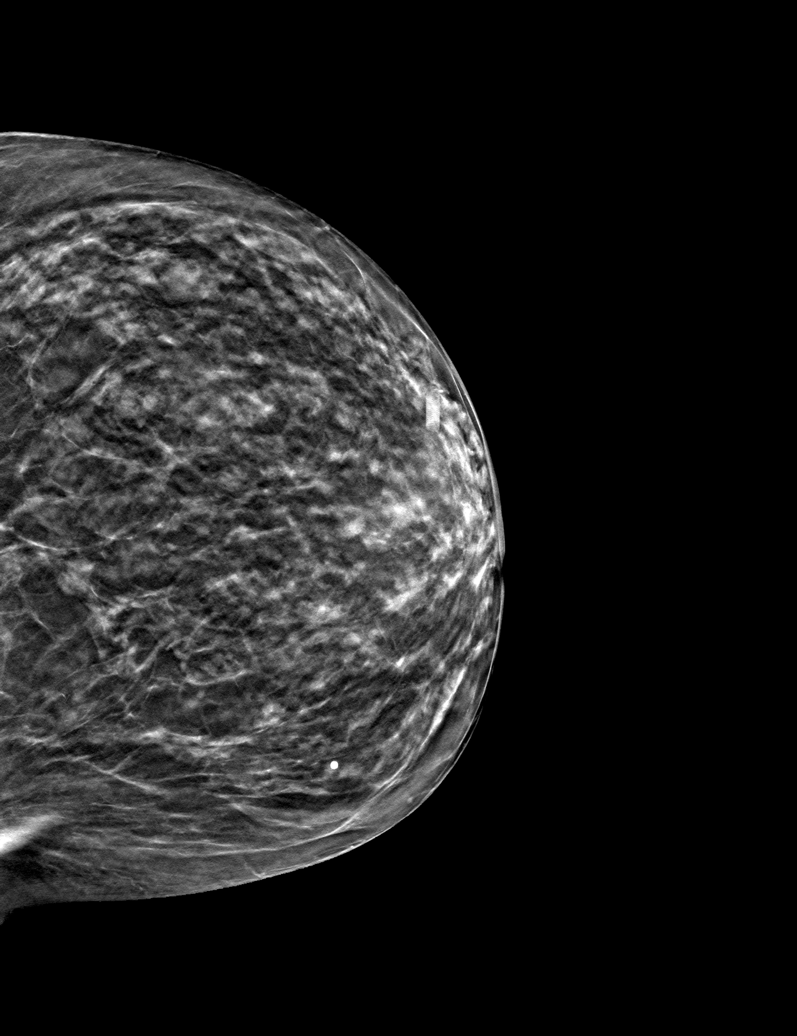

[L ML tomo · tomo slice 31/61.0]
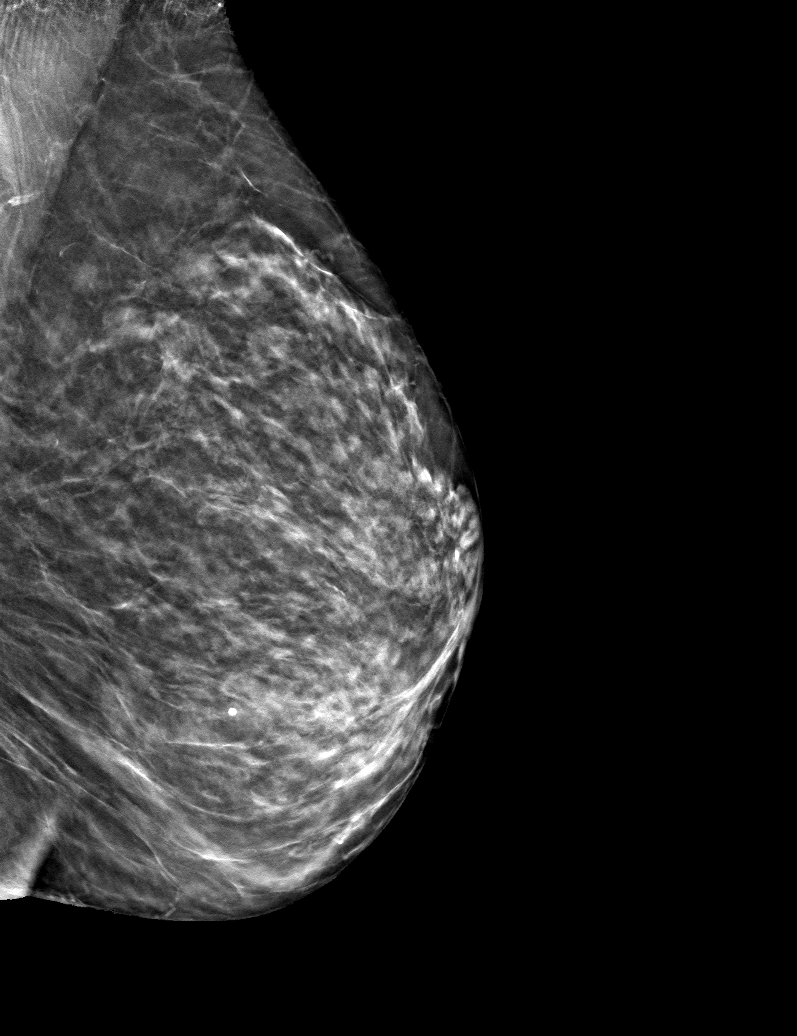

[L CC tomo (2 of 2) · tomo slice 23/46.0]
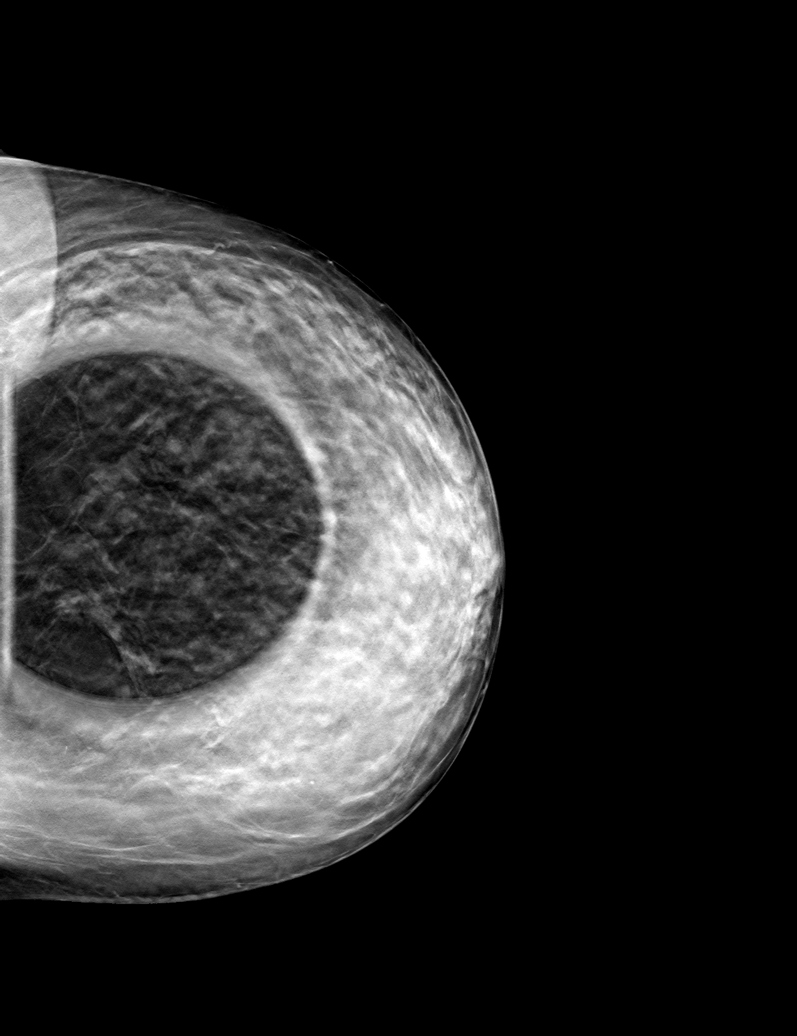

[6 of 18 positions shown; findings below may reference images not displayed]

ACR Breast Density Category c: The breast tissue is heterogeneously
dense, which may obscure small masses.
FINDINGS: On today's additional diagnostic views, there is no persistent
asymmetry within the LEFT breast indicating superimposition of
normal fibroglandular tissues. On today's exam, the fibroglandular
pattern of the posterior LEFT breast is shown to be stable for
greater than 3 years confirming benignity.

Mammographic images were processed with CAD.
IMPRESSION: No evidence of malignancy. Patient may return to routine annual
bilateral screening mammogram schedule.

RECOMMENDATION:
Screening mammogram in one year.(Code:[id number])

I have discussed the findings and recommendations with the patient.
If applicable, a reminder letter will be sent to the patient
regarding the next appointment.

BI-RADS CATEGORY  1: Negative.

## 2021-08-16 DIAGNOSIS — R079 Chest pain, unspecified: Secondary | ICD-10-CM | POA: Diagnosis not present

## 2021-08-16 DIAGNOSIS — K219 Gastro-esophageal reflux disease without esophagitis: Secondary | ICD-10-CM | POA: Diagnosis not present

## 2021-08-16 DIAGNOSIS — R9431 Abnormal electrocardiogram [ECG] [EKG]: Secondary | ICD-10-CM | POA: Diagnosis not present

## 2021-08-16 DIAGNOSIS — K224 Dyskinesia of esophagus: Secondary | ICD-10-CM | POA: Diagnosis not present

## 2021-08-16 DIAGNOSIS — R0789 Other chest pain: Secondary | ICD-10-CM | POA: Diagnosis not present

## 2021-08-16 DIAGNOSIS — Z20822 Contact with and (suspected) exposure to covid-19: Secondary | ICD-10-CM | POA: Diagnosis not present

## 2021-09-08 DIAGNOSIS — H5203 Hypermetropia, bilateral: Secondary | ICD-10-CM | POA: Diagnosis not present

## 2021-09-08 DIAGNOSIS — H2513 Age-related nuclear cataract, bilateral: Secondary | ICD-10-CM | POA: Diagnosis not present

## 2021-12-15 DIAGNOSIS — I87393 Chronic venous hypertension (idiopathic) with other complications of bilateral lower extremity: Secondary | ICD-10-CM | POA: Diagnosis not present

## 2021-12-17 DIAGNOSIS — R42 Dizziness and giddiness: Secondary | ICD-10-CM | POA: Diagnosis not present

## 2021-12-19 DIAGNOSIS — R42 Dizziness and giddiness: Secondary | ICD-10-CM | POA: Diagnosis not present

## 2022-01-05 DIAGNOSIS — I83891 Varicose veins of right lower extremities with other complications: Secondary | ICD-10-CM | POA: Diagnosis not present

## 2022-01-08 DIAGNOSIS — I83811 Varicose veins of right lower extremities with pain: Secondary | ICD-10-CM | POA: Diagnosis not present

## 2022-01-08 DIAGNOSIS — Z09 Encounter for follow-up examination after completed treatment for conditions other than malignant neoplasm: Secondary | ICD-10-CM | POA: Diagnosis not present

## 2022-02-04 DIAGNOSIS — I83891 Varicose veins of right lower extremities with other complications: Secondary | ICD-10-CM | POA: Diagnosis not present

## 2022-02-11 DIAGNOSIS — R1032 Left lower quadrant pain: Secondary | ICD-10-CM | POA: Diagnosis not present

## 2022-02-12 DIAGNOSIS — R1032 Left lower quadrant pain: Secondary | ICD-10-CM | POA: Diagnosis not present

## 2022-02-13 DIAGNOSIS — R1032 Left lower quadrant pain: Secondary | ICD-10-CM | POA: Diagnosis not present

## 2022-02-14 DIAGNOSIS — R1032 Left lower quadrant pain: Secondary | ICD-10-CM | POA: Diagnosis not present

## 2022-02-15 DIAGNOSIS — R1032 Left lower quadrant pain: Secondary | ICD-10-CM | POA: Diagnosis not present

## 2022-02-24 DIAGNOSIS — R42 Dizziness and giddiness: Secondary | ICD-10-CM | POA: Diagnosis not present

## 2022-03-02 DIAGNOSIS — I83891 Varicose veins of right lower extremities with other complications: Secondary | ICD-10-CM | POA: Diagnosis not present

## 2022-03-02 DIAGNOSIS — I83811 Varicose veins of right lower extremities with pain: Secondary | ICD-10-CM | POA: Diagnosis not present

## 2022-03-02 DIAGNOSIS — M7989 Other specified soft tissue disorders: Secondary | ICD-10-CM | POA: Diagnosis not present

## 2022-03-15 DIAGNOSIS — I83891 Varicose veins of right lower extremities with other complications: Secondary | ICD-10-CM | POA: Diagnosis not present

## 2022-03-15 NOTE — Progress Notes (Signed)
HPI: [name] is a 76 y.o. female, who is here today for her routine physical.  States that she has not had a CPE in over a year. She was last seen on 06/24/2020.  Regular exercise : She walks her dog daily and plays tennis 2 times per week. She follows a healthful diet:   Chronic medical problems: Seasonal allergies, OA, subclinical hypothyroidism, varicose vein disease among some.  Immunization History  Administered Date(s) Administered   Influenza, High Dose Seasonal PF 06/22/2017, 06/25/2018, 07/02/2019   Influenza-Unspecified 07/04/2014, 06/25/2015, 07/04/2016   Moderna Sars-Covid-2 Vaccination 11/15/2019   Pneumococcal Conjugate-13 03/28/2015   Pneumococcal Polysaccharide-23 03/16/2022   Td 11/04/2009   Health Maintenance  Topic Date Due   COLONOSCOPY (Pts 45-10yr Insurance coverage will need to be confirmed)  10/04/2016   COVID-19 Vaccine (2 - Moderna risk series) 04/01/2022 (Originally 12/13/2019)   TETANUS/TDAP  10/08/2022 (Originally 11/05/2019)   Zoster Vaccines- Shingrix (1 of 2) 10/08/2022 (Originally 10/16/1964)   INFLUENZA VACCINE  05/04/2022   MAMMOGRAM  05/07/2022   Pneumonia Vaccine 76 Years old  Completed   DEXA SCAN  Completed   Hepatitis C Screening  Completed   HPV VACCINES  Aged Out   Since her last visit she has follow-up with vein specialist and has follow-up with internal medicine for dizziness (12/2021). Having intermittent episodes of dizziness that last a few seconds, once in a while. No associated symptoms. Happens at rest.  [facility] for diabetes and cholesterol on 11/17/2021.  POC TOTAL CHOLESTEROL  <200 mg/dL 190   POC TRIGLYCERIDES <150 mg/dL 68   POC HDL 40 - 60 mg/dL 85 Abnormal    POC LDL CHOLESTEROL <100 mg/dL 92   POC TC/HDL-C RATIO <4.5 2.2    POC Glucose 73 - 118 mg/dL 92    Osteoporosis: Stopped alendronate 70 mg a year ago and she is not interested in taking  medication.  She does not remember for how long she took medication. She is not taking Ca++ or vit D supplementation.  Last DEXA on 03/11/2020: Osteoporosis.  Toes and calves cramps at night. Problem has been going on for a month. She had same problem years ago, K+ supplementation helped. She has not noted edema,erythema,numbness,or tingling.  Hx of subclinical hypothyroidism. Lab Results  Component Value Date   TSH 2.91 06/24/2020   Review of Systems  Constitutional:  Negative for appetite change and fever.  HENT:  Negative for hearing loss, mouth sores, sore throat, trouble swallowing and voice change.   Eyes:  Negative for redness and visual disturbance.  Respiratory:  Negative for cough, shortness of breath and wheezing.   Cardiovascular:  Negative for chest pain and leg swelling.  Gastrointestinal:  Negative for abdominal pain, nausea and vomiting.       No changes in bowel habits.  Endocrine: Negative for cold intolerance, heat intolerance, polydipsia, polyphagia and polyuria.  Genitourinary:  Negative for decreased urine volume, dysuria, hematuria, vaginal bleeding and vaginal discharge.  Musculoskeletal:  Positive for arthralgias. Negative for gait problem.  Skin:  Negative for color change and rash.  Allergic/Immunologic: Positive for environmental allergies.  Neurological:  Negative for syncope, weakness and headaches.  Hematological:  Negative for adenopathy. Does not bruise/bleed easily.  Psychiatric/Behavioral:  Negative for confusion. The patient is not nervous/anxious.   All other systems reviewed and are negative.  No current outpatient medications on file prior to visit.   No current facility-administered medications  on file prior to visit.   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   Varicose veins    Past Surgical  History:  Procedure Laterality Date   ABDOMINAL HYSTERECTOMY     APPENDECTOMY     COLECTOMY     Allergies  Allergen Reactions   Penicillins Rash   Family History  Problem Relation Age of Onset   Thyroid disease Mother    Arthritis Mother    Cancer Father        lung ca , cad, smoker   Heart disease Father    Social History   Socioeconomic History   Marital status: Married    Spouse name: Not on file   Number of children: 1   Years of education: Not on file   Highest education level: Not on file  Occupational History   Occupation: coordinator  Tobacco Use   Smoking status: Never   Smokeless tobacco: Never  Substance and Sexual Activity   Alcohol use: Yes    Alcohol/week: 0.0 standard drinks of alcohol   Drug use: No   Sexual activity: Not on file  Other Topics Concern   Not on file  Social History Narrative   Not on file   Social Determinants of Health   Financial Resource Strain: Not on file  Food Insecurity: Not on file  Transportation Needs: Not on file  Physical Activity: Not on file  Stress: Not on file  Social Connections: Not on file   Vitals:   03/16/22 0828  BP: 100/60  Pulse: 74  Resp: 16  Temp: 98.5 F (36.9 C)  SpO2: 99%  Body mass index is 20.86 kg/m. Wt Readings from Last 3 Encounters:  03/16/22 125 lb 6 oz (56.9 kg)  06/24/20 126 lb (57.2 kg)  07/31/19 121 lb (54.9 kg)   Physical Exam Vitals and nursing note reviewed.  Constitutional:      General: She is not in acute distress.    Appearance: She is well-developed.  HENT:     Head: Normocephalic and atraumatic.     Right Ear: Tympanic membrane, ear canal and external ear normal.     Left Ear: Tympanic membrane, ear canal and external ear normal.     Mouth/Throat:     Mouth: Mucous membranes are moist.     Pharynx: Oropharynx is clear. Uvula midline.  Eyes:     Conjunctiva/sclera: Conjunctivae normal.     Pupils: Pupils are equal, round, and reactive to light.  Neck:      Thyroid: No thyroid mass.  Cardiovascular:     Rate and Rhythm: Normal rate and regular rhythm.     Pulses:          Dorsalis pedis pulses are 2+ on the right side and 2+ on the left side.     Heart sounds: No murmur heard. Pulmonary:     Effort: Pulmonary effort is normal. No respiratory distress.     Breath sounds: Normal breath sounds.  Abdominal:     Palpations: Abdomen is soft. There is no hepatomegaly or mass.     Tenderness: There is no abdominal tenderness.  Musculoskeletal:     Comments: No signs of synovitis appreciated.  Lymphadenopathy:     Cervical: No cervical adenopathy.  Skin:    General: Skin is warm.     Findings: No erythema or rash.  Neurological:  General: No focal deficit present.     Mental Status: She is alert and oriented to person, place, and time.     Cranial Nerves: No cranial nerve deficit.     Coordination: Coordination normal.     Gait: Gait normal.     Deep Tendon Reflexes:     Reflex Scores:      Bicep reflexes are 2+ on the right side and 2+ on the left side.      Patellar reflexes are 2+ on the right side and 2+ on the left side. Psychiatric:     Comments: Well groomed, good eye contact.   ASSESSMENT AND PLAN:  [name] was here today annual physical examination.  Orders Placed This Encounter  Procedures   Pneumococcal polysaccharide vaccine 23-valent greater than or equal to 2yo subcutaneous/IM   Hepatitis C antibody   Cologuard   Basic metabolic panel   Lipid panel   CBC   TSH   Lab Results  Component Value Date   TSH 2.22 03/16/2022   Lab Results  Component Value Date   WBC 5.1 03/16/2022   HGB 13.4 03/16/2022   HCT 40.1 03/16/2022   MCV 98.4 03/16/2022   PLT 172.0 03/16/2022   Lab Results  Component Value Date   CHOL 212 (H) 03/16/2022   HDL 82.40 03/16/2022   LDLCALC 114 (H) 03/16/2022   LDLDIRECT 114.5 11/14/2012   TRIG 78.0 03/16/2022   CHOLHDL 3 03/16/2022   Lab Results  Component Value  Date   CREATININE 0.90 03/16/2022   BUN 21 03/16/2022   NA 139 03/16/2022   K 4.6 03/16/2022   CL 104 03/16/2022   CO2 28 03/16/2022   Routine general medical examination at a health care facility We discussed the importance of regular physical activity and healthy diet for prevention of chronic illness and/or complications. Preventive guidelines reviewed. Vaccination updated. Ca++ and vit D supplementation recommended. Next CPE in a year. The 10-year ASCVD risk score (Arnett DK, et al., 2019) is: 9.2%   Values used to calculate the score:     Age: 62 years     Sex: Female     Is Non-Hispanic African American: No     Diabetic: No     Tobacco smoker: No     Systolic Blood Pressure: 989 mmHg     Is BP treated: No     HDL Cholesterol: 82.4 mg/dL     Total Cholesterol: 212 mg/dL  Encounter for HCV screening test for low risk patient -     Hepatitis C antibody  Hyperlipidemia, unspecified hyperlipidemia type She would like to have it check today. Non pharmacologic treatment recommended for now. Further recommendations will be given according to 10 years CVD risk score and lipid panel numbers.  Colon cancer screening -     Cologuard  Subclinical hypothyroidism Last TSH in normal range. Further recommendations according to TSH.  Cramps of lower extremity Possible causes discussed. Adequate hydration. BMP + other labs ordered today.  Dizziness, nonspecific Hx does not suggest a serious process. Fall precautions discussed. Monitor for new symptoms. Further recommendations according to lab results.  Need for 23-polyvalent pneumococcal polysaccharide vaccine -     Pneumococcal polysaccharide vaccine 23-valent greater than or equal to 2yo subcutaneous/IM  Return in 1 year (on 03/17/2023).  Jaquae Rieves G. Martinique, MD  [facility]. [place] office.

## 2022-03-16 ENCOUNTER — Encounter: Payer: Self-pay | Admitting: Family Medicine

## 2022-03-16 ENCOUNTER — Ambulatory Visit (INDEPENDENT_AMBULATORY_CARE_PROVIDER_SITE_OTHER): Payer: Medicare HMO | Admitting: Family Medicine

## 2022-03-16 VITALS — BP 100/60 | HR 74 | Temp 98.5°F | Resp 16 | Ht 65.0 in | Wt 125.4 lb

## 2022-03-16 DIAGNOSIS — Z1159 Encounter for screening for other viral diseases: Secondary | ICD-10-CM | POA: Diagnosis not present

## 2022-03-16 DIAGNOSIS — R252 Cramp and spasm: Secondary | ICD-10-CM | POA: Diagnosis not present

## 2022-03-16 DIAGNOSIS — E038 Other specified hypothyroidism: Secondary | ICD-10-CM

## 2022-03-16 DIAGNOSIS — R42 Dizziness and giddiness: Secondary | ICD-10-CM | POA: Diagnosis not present

## 2022-03-16 DIAGNOSIS — Z0000 Encounter for general adult medical examination without abnormal findings: Secondary | ICD-10-CM | POA: Diagnosis not present

## 2022-03-16 DIAGNOSIS — Z23 Encounter for immunization: Secondary | ICD-10-CM | POA: Diagnosis not present

## 2022-03-16 DIAGNOSIS — E785 Hyperlipidemia, unspecified: Secondary | ICD-10-CM | POA: Diagnosis not present

## 2022-03-16 DIAGNOSIS — Z1211 Encounter for screening for malignant neoplasm of colon: Secondary | ICD-10-CM | POA: Diagnosis not present

## 2022-03-16 LAB — LIPID PANEL
Cholesterol: 212 mg/dL — ABNORMAL HIGH (ref 0–200)
HDL: 82.4 mg/dL (ref >39.00)
LDL Cholesterol: 114 mg/dL — ABNORMAL HIGH (ref 0–99)
NonHDL: 129.97
Total CHOL/HDL Ratio: 3
Triglycerides: 78 mg/dL (ref 0.0–149.0)
VLDL: 15.6 mg/dL (ref 0.0–40.0)

## 2022-03-16 LAB — CBC
HCT: 40.1 % (ref 36.0–46.0)
Hemoglobin: 13.4 g/dL (ref 12.0–15.0)
MCHC: 33.5 g/dL (ref 30.0–36.0)
MCV: 98.4 fl (ref 78.0–100.0)
Platelets: 172 10*3/uL (ref 150.0–400.0)
RBC: 4.08 Mil/uL (ref 3.87–5.11)
RDW: 13.1 % (ref 11.5–15.5)
WBC: 5.1 10*3/uL (ref 4.0–10.5)

## 2022-03-16 LAB — BASIC METABOLIC PANEL
BUN: 21 mg/dL (ref 6–23)
CO2: 28 mEq/L (ref 19–32)
Calcium: 9.7 mg/dL (ref 8.4–10.5)
Chloride: 104 mEq/L (ref 96–112)
Creatinine, Ser: 0.9 mg/dL (ref 0.40–1.20)
GFR: 63.02 mL/min (ref >60.00)
Glucose, Bld: 89 mg/dL (ref 70–99)
Potassium: 4.6 mEq/L (ref 3.5–5.1)
Sodium: 139 mEq/L (ref 135–145)

## 2022-03-16 LAB — TSH: TSH: 2.22 u[IU]/mL (ref 0.35–5.50)

## 2022-03-16 NOTE — Patient Instructions (Addendum)
A few things to remember from today's visit:  Routine general medical examination at a health care facility  Encounter for HCV screening test for low risk patient - Plan: Hepatitis C antibody  Hyperlipidemia, unspecified hyperlipidemia type - Plan: Lipid panel  Colon cancer screening - Plan: Cologuard  Subclinical hypothyroidism - Plan: TSH  Cramps of lower extremity - Plan: Basic metabolic panel, CBC  If you need refills please call your pharmacy. Do not use My Chart to request refills or for acute issues that need immediate attention.   Please be sure medication list is accurate. If a new problem present, please set up appointment sooner than planned today.  Preventive Care 30 Years and Older, Female Preventive care refers to lifestyle choices and visits with your health care provider that can promote health and wellness. Preventive care visits are also called wellness exams. What can I expect for my preventive care visit? Counseling Your health care provider may ask you questions about your: Medical history, including: Past medical problems. Family medical history. Pregnancy and menstrual history. History of falls. Current health, including: Memory and ability to understand (cognition). Emotional well-being. Home life and relationship well-being. Sexual activity and sexual health. Lifestyle, including: Alcohol, nicotine or tobacco, and drug use. Access to firearms. Diet, exercise, and sleep habits. Work and work Statistician. Sunscreen use. Safety issues such as seatbelt and bike helmet use. Physical exam Your health care provider will check your: Height and weight. These may be used to calculate your BMI (body mass index). BMI is a measurement that tells if you are at a healthy weight. Waist circumference. This measures the distance around your waistline. This measurement also tells if you are at a healthy weight and may help predict your risk of certain diseases, such  as type 2 diabetes and high blood pressure. Heart rate and blood pressure. Body temperature. Skin for abnormal spots. What immunizations do I need?  Vaccines are usually given at various ages, according to a schedule. Your health care provider will recommend vaccines for you based on your age, medical history, and lifestyle or other factors, such as travel or where you work. What tests do I need? Screening Your health care provider may recommend screening tests for certain conditions. This may include: Lipid and cholesterol levels. Hepatitis C test. Hepatitis B test. HIV (human immunodeficiency virus) test. STI (sexually transmitted infection) testing, if you are at risk. Lung cancer screening. Colorectal cancer screening. Diabetes screening. This is done by checking your blood sugar (glucose) after you have not eaten for a while (fasting). Mammogram. Talk with your health care provider about how often you should have regular mammograms. BRCA-related cancer screening. This may be done if you have a family history of breast, ovarian, tubal, or peritoneal cancers. Bone density scan. This is done to screen for osteoporosis. Talk with your health care provider about your test results, treatment options, and if necessary, the need for more tests. Follow these instructions at home: Eating and drinking  Eat a diet that includes fresh fruits and vegetables, whole grains, lean protein, and low-fat dairy products. Limit your intake of foods with high amounts of sugar, saturated fats, and salt. Take vitamin and mineral supplements as recommended by your health care provider. Do not drink alcohol if your health care provider tells you not to drink. If you drink alcohol: Limit how much you have to 0-1 drink a day. Know how much alcohol is in your drink. In the [place] one drink equals one 12 oz  bottle of beer (355 mL), one 5 oz glass of wine (148 mL), or one 1 oz glass of hard liquor (44  mL). Lifestyle Brush your teeth every morning and night with fluoride toothpaste. Floss one time each day. Exercise for at least 30 minutes 5 or more days each week. Do not use any products that contain nicotine or tobacco. These products include cigarettes, chewing tobacco, and vaping devices, such as e-cigarettes. If you need help quitting, ask your health care provider. Do not use drugs. If you are sexually active, practice safe sex. Use a condom or other form of protection in order to prevent STIs. Take aspirin only as told by your health care provider. Make sure that you understand how much to take and what form to take. Work with your health care provider to find out whether it is safe and beneficial for you to take aspirin daily. Ask your health care provider if you need to take a cholesterol-lowering medicine (statin). Find healthy ways to manage stress, such as: Meditation, yoga, or listening to music. Journaling. Talking to a trusted person. Spending time with friends and family. Minimize exposure to UV radiation to reduce your risk of skin cancer. Safety Always wear your seat belt while driving or riding in a vehicle. Do not drive: If you have been drinking alcohol. Do not ride with someone who has been drinking. When you are tired or distracted. While texting. If you have been using any mind-altering substances or drugs. Wear a helmet and other protective equipment during sports activities. If you have firearms in your house, make sure you follow all gun safety procedures. What's next? Visit your health care provider once a year for an annual wellness visit. Ask your health care provider how often you should have your eyes and teeth checked. Stay up to date on all vaccines. This information is not intended to replace advice given to you by your health care provider. Make sure you discuss any questions you have with your health care provider. Document Revised: 03/18/2021  Document Reviewed: 03/18/2021 Elsevier Patient Education  [place].

## 2022-03-17 LAB — HEPATITIS C ANTIBODY
Hepatitis C Ab: NONREACTIVE
SIGNAL TO CUT-OFF: 0.1 (ref <1.00)

## 2022-03-18 ENCOUNTER — Encounter: Payer: Self-pay | Admitting: Family Medicine

## 2022-03-21 DIAGNOSIS — E785 Hyperlipidemia, unspecified: Secondary | ICD-10-CM | POA: Diagnosis not present

## 2022-03-22 DIAGNOSIS — E785 Hyperlipidemia, unspecified: Secondary | ICD-10-CM | POA: Diagnosis not present

## 2022-03-22 MED ORDER — PRAVASTATIN SODIUM 10 MG PO TABS: 10.0000 mg | ORAL_TABLET | Freq: Every day | ORAL | 3 refills | Status: DC

## 2022-03-22 NOTE — Telephone Encounter (Signed)
Pt is calling back and would like chole med pravastatin #[phone] to be sent to  [facility] [id number] - [place], [address] Phone:  [phone]  Fax:  [phone]

## 2022-03-22 NOTE — Telephone Encounter (Signed)
Rx sent in

## 2022-03-23 DIAGNOSIS — E785 Hyperlipidemia, unspecified: Secondary | ICD-10-CM | POA: Diagnosis not present

## 2022-03-29 ENCOUNTER — Ambulatory Visit (INDEPENDENT_AMBULATORY_CARE_PROVIDER_SITE_OTHER): Payer: Medicare HMO

## 2022-03-29 VITALS — Ht 65.0 in | Wt 125.0 lb

## 2022-03-29 DIAGNOSIS — I872 Venous insufficiency (chronic) (peripheral): Secondary | ICD-10-CM | POA: Diagnosis not present

## 2022-03-29 DIAGNOSIS — Z0000 Encounter for general adult medical examination without abnormal findings: Secondary | ICD-10-CM

## 2022-03-29 NOTE — Progress Notes (Signed)
Subjective:   [name] is a 76 y.o. female who presents for Medicare Annual (Subsequent) preventive examination.  Review of Systems    Virtual Visit via Telephone Note  I connected with  [name] on 03/29/22 at  8:15 AM EDT by telephone and verified that I am speaking with the correct person using two identifiers.  Location: Patient: Home Provider: Office Persons participating in the virtual visit: patient/Nurse Health Advisor   I discussed the limitations, risks, security and privacy concerns of performing an evaluation and management service by telephone and the availability of in person appointments. The patient expressed understanding and agreed to proceed.  Interactive audio and video telecommunications were attempted between this nurse and patient, however failed, due to patient having technical difficulties OR patient did not have access to video capability.  We continued and completed visit with audio only.  Some vital signs may be absent or patient reported.   Criselda Peaches, LPN  Cardiac Risk Factors include: advanced age (>65mn, >>68women)     Objective:    Today's Vitals   03/29/22 0818  Weight: 125 lb (56.7 kg)  Height: '5\' 5"'$  (1.651 m)   Body mass index is 20.8 kg/m.     03/29/2022    8:26 AM 10/24/2019    1:02 PM 12/22/2015    9:50 AM 11/13/2015    2:27 PM 03/18/2015    8:36 AM 11/07/2014    2:44 PM  Advanced Directives  Does Patient Have a Medical Advance Directive? Yes Yes No No No Yes  Type of AParamedicof AJudson[place]Living will [place] will    Living will;Healthcare Power of Attorney  Does patient want to make changes to medical advance directive? No - Patient declined       Copy of HPiney Greenin Chart? No - copy requested No - copy requested    No - copy requested  Would patient like information on creating a medical advance directive?    No - patient declined  information Yes - Educational materials given     Current Medications (verified) Outpatient Encounter Medications as of 03/29/2022  Medication Sig   pravastatin (PRAVACHOL) 10 MG tablet Take 1 tablet (10 mg total) by mouth daily.   No facility-administered encounter medications on file as of 03/29/2022.    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   Varicose veins    Past Surgical History:  Procedure Laterality Date   ABDOMINAL HYSTERECTOMY     APPENDECTOMY     COLECTOMY     Family History  Problem Relation Age of Onset   Thyroid disease Mother    Arthritis Mother    Cancer Father        lung ca , cad, smoker   Heart disease Father    Social History   Socioeconomic History   Marital status: Married    Spouse name: Not on file   Number of children: 1   Years of education: Not on file   Highest education level: Not on file  Occupational History   Occupation: coordinator  Tobacco Use   Smoking status: Never   Smokeless tobacco: Never  Substance and Sexual Activity   Alcohol use: Yes    Alcohol/week: 0.0 standard drinks of alcohol   Drug use: No  Sexual activity: Not on file  Other Topics Concern   Not on file  Social History Narrative   Not on file   Social Determinants of Health   Financial Resource Strain: Low Risk  (03/29/2022)   Overall Financial Resource Strain (CARDIA)    Difficulty of Paying Living Expenses: Not hard at all  Food Insecurity: No Food Insecurity (03/29/2022)   Hunger Vital Sign    Worried About Running Out of Food in the Last Year: Never true    Ran Out of Food in the Last Year: Never true  Transportation Needs: No Transportation Needs (03/29/2022)   PRAPARE - Hydrologist (Medical): No    Lack of Transportation  (Non-Medical): No  Physical Activity: Sufficiently Active (03/29/2022)   Exercise Vital Sign    Days of Exercise per Week: 2 days    Minutes of Exercise per Session: 150+ min  Stress: No Stress Concern Present (03/29/2022)   [place]    Feeling of Stress : Not at all  Social Connections: Unknown (03/29/2022)   Social Connection and Isolation Panel [NHANES]    Frequency of Communication with Friends and Family: More than three times a week    Frequency of Social Gatherings with Friends and Family: More than three times a week    Attends Religious Services: More than 4 times per year    Active Member of [employer] or Organizations: Yes    Attends Music therapist: More than 4 times per year    Marital Status: Not on file    Clinical Intake:  Pre-visit preparation completed: No  Pain : No/denies pain     BMI - recorded: 20.86 Nutritional Status: BMI of 19-24  Normal Nutritional Risks: None Diabetes: No  How often do you need to have someone help you when you read instructions, pamphlets, or other written materials from your doctor or pharmacy?: 1 - Never  Diabetic?  Ni  Interpreter Needed?: No  Information entered by :: Rolene Arbour LPN   Activities of Daily Living    03/29/2022    8:25 AM  In your present state of health, do you have any difficulty performing the following activities:  Hearing? 0  Vision? 0  Difficulty concentrating or making decisions? 0  Walking or climbing stairs? 0  Dressing or bathing? 0  Doing errands, shopping? 0  Preparing Food and eating ? N  Using the Toilet? N  In the past six months, have you accidently leaked urine? N  Do you have problems with loss of bowel control? N  Managing your Medications? N  Managing your Finances? N  Housekeeping or managing your Housekeeping? N    Patient Care Team: Martinique, Betty G, MD as PCP - General (Family Medicine) Lavonna Monarch, MD as Consulting Physician (Dermatology)  Indicate any recent Medical Services you may have received from other than [facility] providers in the past year (date may be approximate).     Assessment:   This is a routine wellness examination for [name].  Hearing/Vision screen Hearing Screening - Comments:: No hearing difficulty Vision Screening - Comments:: Wears contacts. Followed by Dr Georgina Peer  Dietary issues and exercise activities discussed: Exercise limited by: None identified   Goals Addressed               This Visit's Progress     Patient stated (pt-stated)        Stay healthy.  Depression Screen    03/29/2022    8:22 AM 03/16/2022    8:40 AM 06/27/2020   11:21 PM 10/19/2019   11:20 PM 04/09/2018    3:24 PM 01/28/2017    8:11 AM 03/28/2015    8:42 AM  PHQ 2/9 Scores  PHQ - 2 Score 0 0 0 0 0 0 0  PHQ- 9 Score 0 2         Fall Risk    03/29/2022    8:25 AM 03/16/2022    8:40 AM 06/27/2020   11:20 PM 05/02/2019    5:08 PM 01/28/2017    8:11 AM  [place] in the past year? 0 0 0  No  Comment    Emmi Telephone Survey: data to providers prior to load   Number falls in past yr: 0 0 0    Comment    Emmi Telephone Survey Actual Response =    Injury with Fall? 0 0 0    Risk for fall due to : No Fall Risks No Fall Risks     Follow up  Falls evaluation completed Education provided      [place]:  Any stairs in or around the home? Yes  If so, are there any without handrails? No  Home free of loose throw rugs in walkways, pet beds, electrical cords, etc? Yes  Adequate lighting in your home to reduce risk of falls? Yes   ASSISTIVE DEVICES UTILIZED TO PREVENT FALLS:  Life alert? No  Use of a cane, walker or w/c? No  Grab bars in the bathroom? Yes  Shower chair or bench in shower? No  Elevated toilet seat or a handicapped toilet? No   TIMED UP AND GO:  Was the test performed? No . Audio Visit  Cognitive  Function:        03/29/2022    8:25 AM  6CIT Screen  What Year? 0 points  What month? 0 points  What time? 0 points  Count back from 20 0 points  Months in reverse 0 points  Repeat phrase 0 points  Total Score 0 points    Immunizations Immunization History  Administered Date(s) Administered   Influenza, High Dose Seasonal PF 06/22/2017, 06/25/2018, 07/02/2019   Influenza-Unspecified 07/04/2014, 06/25/2015, 07/04/2016   Moderna Sars-Covid-2 Vaccination 11/15/2019   Pneumococcal Conjugate-13 03/28/2015   Pneumococcal Polysaccharide-23 03/16/2022   Td 11/04/2009       Pneumococcal vaccine status: Up to date  Covid-19 vaccine status: Completed vaccines  Qualifies for Shingles Vaccine? Yes   Zostavax completed No   Shingrix Completed?: No.    Education has been provided regarding the importance of this vaccine. Patient has been advised to call insurance company to determine out of pocket expense if they have not yet received this vaccine. Advised may also receive vaccine at local pharmacy or Health Dept. Verbalized acceptance and understanding.  Screening Tests Health Maintenance  Topic Date Due   COVID-19 Vaccine (2 - Moderna risk series) 04/01/2022 (Originally 12/13/2019)   TETANUS/TDAP  10/08/2022 (Originally 11/05/2019)   Zoster Vaccines- Shingrix (1 of 2) 10/08/2022 (Originally 10/16/1964)   COLONOSCOPY (Pts 45-52yr Insurance coverage will need to be confirmed)  03/30/2023 (Originally 10/04/2016)   INFLUENZA VACCINE  05/04/2022   MAMMOGRAM  05/07/2022   Pneumonia Vaccine 76 Years old  Completed   DEXA SCAN  Completed   Hepatitis C Screening  Completed   HPV VACCINES  Aged Out  Health Maintenance  There are no preventive care reminders to display for this patient.   Colorectal cancer screening: Referral to GI placed Patient deferred. Pt aware the office will call re: appt.  Mammogram status: Completed 05/07/21. Repeat every year  Bone Density status: Completed  03/11/20. Results reflect: Bone density results: OSTEOPOROSIS. Repeat every 2 years.  Lung Cancer Screening: (Low Dose CT Chest recommended if Age 49-80 years, 30 pack-year currently smoking OR have quit w/in 15years.) does not qualify.    Additional Screening:  Hepatitis C Screening: does qualify; Completed 03/16/22  Vision Screening: Recommended annual ophthalmology exams for early detection of glaucoma and other disorders of the eye. Is the patient up to date with their annual eye exam?  Yes  Who is the provider or what is the name of the office in which the patient attends annual eye exams? Dr Georgina Peer If pt is not established with a provider, would they like to be referred to a provider to establish care? No .   Dental Screening: Recommended annual dental exams for proper oral hygiene  Community Resource Referral / Chronic Care Management:  CRR required this visit?  No   CCM required this visit?  No      Plan:     I have personally reviewed and noted the following in the patient's chart:   Medical and social history Use of alcohol, tobacco or illicit drugs  Current medications and supplements including opioid prescriptions.  Functional ability and status Nutritional status Physical activity Advanced directives List of other physicians Hospitalizations, surgeries, and ER visits in previous 12 months Vitals Screenings to include cognitive, depression, and falls Referrals and appointments  In addition, I have reviewed and discussed with patient certain preventive protocols, quality metrics, and best practice recommendations. A written personalized care plan for preventive services as well as general preventive health recommendations were provided to patient.     Criselda Peaches, LPN   1/75/1025   Nurse Notes: None

## 2022-03-30 DIAGNOSIS — Z1211 Encounter for screening for malignant neoplasm of colon: Secondary | ICD-10-CM | POA: Diagnosis not present

## 2022-03-31 DIAGNOSIS — L570 Actinic keratosis: Secondary | ICD-10-CM | POA: Diagnosis not present

## 2022-03-31 DIAGNOSIS — L820 Inflamed seborrheic keratosis: Secondary | ICD-10-CM | POA: Diagnosis not present

## 2022-03-31 DIAGNOSIS — D229 Melanocytic nevi, unspecified: Secondary | ICD-10-CM | POA: Diagnosis not present

## 2022-03-31 DIAGNOSIS — L821 Other seborrheic keratosis: Secondary | ICD-10-CM | POA: Diagnosis not present

## 2022-03-31 DIAGNOSIS — D225 Melanocytic nevi of trunk: Secondary | ICD-10-CM | POA: Diagnosis not present

## 2022-03-31 DIAGNOSIS — L720 Epidermal cyst: Secondary | ICD-10-CM | POA: Diagnosis not present

## 2022-03-31 DIAGNOSIS — L814 Other melanin hyperpigmentation: Secondary | ICD-10-CM | POA: Diagnosis not present

## 2022-03-31 DIAGNOSIS — L578 Other skin changes due to chronic exposure to nonionizing radiation: Secondary | ICD-10-CM | POA: Diagnosis not present

## 2022-04-07 LAB — COLOGUARD: COLOGUARD: NEGATIVE

## 2022-04-13 DIAGNOSIS — I87392 Chronic venous hypertension (idiopathic) with other complications of left lower extremity: Secondary | ICD-10-CM | POA: Diagnosis not present

## 2022-04-13 DIAGNOSIS — I83892 Varicose veins of left lower extremities with other complications: Secondary | ICD-10-CM | POA: Diagnosis not present

## 2022-04-27 DIAGNOSIS — I83892 Varicose veins of left lower extremities with other complications: Secondary | ICD-10-CM | POA: Diagnosis not present

## 2022-04-27 DIAGNOSIS — I87392 Chronic venous hypertension (idiopathic) with other complications of left lower extremity: Secondary | ICD-10-CM | POA: Diagnosis not present

## 2022-05-20 DIAGNOSIS — D485 Neoplasm of uncertain behavior of skin: Secondary | ICD-10-CM | POA: Diagnosis not present

## 2022-05-20 DIAGNOSIS — L821 Other seborrheic keratosis: Secondary | ICD-10-CM | POA: Diagnosis not present

## 2022-05-25 DIAGNOSIS — I83892 Varicose veins of left lower extremities with other complications: Secondary | ICD-10-CM | POA: Diagnosis not present

## 2022-05-26 DIAGNOSIS — E785 Hyperlipidemia, unspecified: Secondary | ICD-10-CM | POA: Diagnosis not present

## 2022-06-01 DIAGNOSIS — D485 Neoplasm of uncertain behavior of skin: Secondary | ICD-10-CM | POA: Diagnosis not present

## 2022-06-09 ENCOUNTER — Other Ambulatory Visit: Payer: Self-pay | Admitting: Family Medicine

## 2022-06-09 DIAGNOSIS — Z1231 Encounter for screening mammogram for malignant neoplasm of breast: Secondary | ICD-10-CM

## 2022-06-24 ENCOUNTER — Ambulatory Visit: Payer: Medicare HMO

## 2022-07-05 ENCOUNTER — Ambulatory Visit: Payer: Medicare HMO

## 2022-07-23 ENCOUNTER — Ambulatory Visit: Payer: Medicare HMO

## 2022-08-25 DIAGNOSIS — I8311 Varicose veins of right lower extremity with inflammation: Secondary | ICD-10-CM | POA: Diagnosis not present

## 2022-08-25 DIAGNOSIS — I83891 Varicose veins of right lower extremities with other complications: Secondary | ICD-10-CM | POA: Diagnosis not present

## 2022-08-25 DIAGNOSIS — L299 Pruritus, unspecified: Secondary | ICD-10-CM | POA: Diagnosis not present

## 2022-08-25 DIAGNOSIS — I8312 Varicose veins of left lower extremity with inflammation: Secondary | ICD-10-CM | POA: Diagnosis not present

## 2022-08-25 DIAGNOSIS — I83893 Varicose veins of bilateral lower extremities with other complications: Secondary | ICD-10-CM | POA: Diagnosis not present

## 2022-09-13 ENCOUNTER — Ambulatory Visit: Payer: Medicare HMO

## 2022-09-21 DIAGNOSIS — H2513 Age-related nuclear cataract, bilateral: Secondary | ICD-10-CM | POA: Diagnosis not present

## 2022-09-21 DIAGNOSIS — H5203 Hypermetropia, bilateral: Secondary | ICD-10-CM | POA: Diagnosis not present

## 2022-10-13 DIAGNOSIS — I83891 Varicose veins of right lower extremities with other complications: Secondary | ICD-10-CM | POA: Diagnosis not present

## 2022-10-14 ENCOUNTER — Ambulatory Visit
Admission: RE | Admit: 2022-10-14 | Discharge: 2022-10-14 | Disposition: A | Discharge status: Home / Self Care | Payer: Medicare HMO | Source: Ambulatory Visit | Attending: Family Medicine | Admitting: Family Medicine

## 2022-10-14 DIAGNOSIS — Z1231 Encounter for screening mammogram for malignant neoplasm of breast: Secondary | ICD-10-CM | POA: Diagnosis not present

## 2022-11-01 NOTE — Progress Notes (Unsigned)
   ACUTE VISIT No chief complaint on file.  HPI: Ms.Leara R Carthen is a 77 y.o. female, who is here today complaining of *** HPI  Review of Systems See other pertinent positives and negatives in HPI.  Current Outpatient Medications on File Prior to Visit  Medication Sig Dispense Refill   pravastatin (PRAVACHOL) 10 MG tablet Take 1 tablet (10 mg total) by mouth daily. 30 tablet 3   No current facility-administered medications on file prior to visit.    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   Varicose veins    Allergies  Allergen Reactions   Penicillins Rash    Social History   Socioeconomic History   Marital status: Married    Spouse name: Not on file   Number of children: 1   Years of education: Not on file   Highest education level: Not on file  Occupational History   Occupation: coordinator  Tobacco Use   Smoking status: Never   Smokeless tobacco: Never  Substance and Sexual Activity   Alcohol use: Yes    Alcohol/week: 0.0 standard drinks of alcohol   Drug use: No   Sexual activity: Not on file  Other Topics Concern   Not on file  Social History Narrative   Not on file   Social Determinants of Health   Financial Resource Strain: Low Risk  (03/29/2022)   Overall Financial Resource Strain (CARDIA)    Difficulty of Paying Living Expenses: Not hard at all  Food Insecurity: No Food Insecurity (03/29/2022)   Hunger Vital Sign    Worried About Running Out of Food in the Last Year: Never true    Ran Out of Food in the Last Year: Never true  Transportation Needs: No Transportation Needs (03/29/2022)   PRAPARE - Hydrologist (Medical): No    Lack of Transportation (Non-Medical): No  Physical Activity: Sufficiently Active (03/29/2022)   Exercise Vital Sign    Days of Exercise per  Week: 2 days    Minutes of Exercise per Session: 150+ min  Stress: No Stress Concern Present (03/29/2022)   [place]    Feeling of Stress : Not at all  Social Connections: Unknown (03/29/2022)   Social Connection and Isolation Panel [NHANES]    Frequency of Communication with Friends and Family: More than three times a week    Frequency of Social Gatherings with Friends and Family: More than three times a week    Attends Religious Services: More than 4 times per year    Active Member of [employer] or Organizations: Yes    Attends Archivist Meetings: More than 4 times per year    Marital Status: Not on file    There were no vitals filed for this visit. There is no height or weight on file to calculate BMI.  Physical Exam  ASSESSMENT AND PLAN: There are no diagnoses linked to this encounter.  No follow-ups on file.  Oniya Mandarino G. Martinique, MD  [facility]. [place] office.  Discharge Instructions   None

## 2022-11-02 ENCOUNTER — Ambulatory Visit (INDEPENDENT_AMBULATORY_CARE_PROVIDER_SITE_OTHER): Payer: Medicare HMO | Admitting: Family Medicine

## 2022-11-02 ENCOUNTER — Encounter: Payer: Self-pay | Admitting: Family Medicine

## 2022-11-02 VITALS — BP 110/70 | HR 64 | Temp 97.6°F | Resp 16 | Ht 65.0 in | Wt 123.5 lb

## 2022-11-02 DIAGNOSIS — I499 Cardiac arrhythmia, unspecified: Secondary | ICD-10-CM

## 2022-11-02 DIAGNOSIS — R42 Dizziness and giddiness: Secondary | ICD-10-CM | POA: Diagnosis not present

## 2022-11-02 NOTE — Patient Instructions (Addendum)
A few things to remember from today's visit:  Dizziness, nonspecific - Plan: Basic metabolic panel, CBC Monitor for new symptoms. Fall precautions.  If you need refills for medications you take chronically, please call your pharmacy. Do not use My Chart to request refills or for acute issues that need immediate attention. If you send a my chart message, it may take a few days to be addressed, specially if I am not in the office.  Please be sure medication list is accurate. If a new problem present, please set up appointment sooner than planned today.

## 2022-11-03 ENCOUNTER — Other Ambulatory Visit (INDEPENDENT_AMBULATORY_CARE_PROVIDER_SITE_OTHER): Payer: Medicare HMO

## 2022-11-03 DIAGNOSIS — R42 Dizziness and giddiness: Secondary | ICD-10-CM | POA: Diagnosis not present

## 2022-11-03 DIAGNOSIS — I83891 Varicose veins of right lower extremities with other complications: Secondary | ICD-10-CM | POA: Diagnosis not present

## 2022-11-03 LAB — BASIC METABOLIC PANEL
BUN: 23 mg/dL (ref 6–23)
CO2: 28 mEq/L (ref 19–32)
Calcium: 9.3 mg/dL (ref 8.4–10.5)
Chloride: 104 mEq/L (ref 96–112)
Creatinine, Ser: 1.01 mg/dL (ref 0.40–1.20)
GFR: 54.63 mL/min — ABNORMAL LOW (ref >60.00)
Glucose, Bld: 90 mg/dL (ref 70–99)
Potassium: 4.2 mEq/L (ref 3.5–5.1)
Sodium: 139 mEq/L (ref 135–145)

## 2022-11-03 LAB — CBC
HCT: 41.2 % (ref 36.0–46.0)
Hemoglobin: 14.3 g/dL (ref 12.0–15.0)
MCHC: 34.6 g/dL (ref 30.0–36.0)
MCV: 97.4 fl (ref 78.0–100.0)
Platelets: 206 10*3/uL (ref 150.0–400.0)
RBC: 4.23 Mil/uL (ref 3.87–5.11)
RDW: 13.1 % (ref 11.5–15.5)
WBC: 5.4 10*3/uL (ref 4.0–10.5)

## 2022-11-16 ENCOUNTER — Telehealth: Payer: Self-pay | Admitting: Family Medicine

## 2022-11-16 DIAGNOSIS — I499 Cardiac arrhythmia, unspecified: Secondary | ICD-10-CM

## 2022-11-16 NOTE — Telephone Encounter (Signed)
Referral entered  

## 2022-11-16 NOTE — Telephone Encounter (Signed)
Per previous conversation with provider, Patient requesting referral to Dr Harrell Gave (cardiologist) at [facility]

## 2022-11-22 DIAGNOSIS — L7211 Pilar cyst: Secondary | ICD-10-CM | POA: Diagnosis not present

## 2022-11-22 DIAGNOSIS — Z411 Encounter for cosmetic surgery: Secondary | ICD-10-CM | POA: Diagnosis not present

## 2022-11-22 DIAGNOSIS — L821 Other seborrheic keratosis: Secondary | ICD-10-CM | POA: Diagnosis not present

## 2022-11-29 ENCOUNTER — Ambulatory Visit (HOSPITAL_BASED_OUTPATIENT_CLINIC_OR_DEPARTMENT_OTHER): Payer: Medicare HMO | Admitting: Cardiology

## 2022-11-29 ENCOUNTER — Encounter (HOSPITAL_BASED_OUTPATIENT_CLINIC_OR_DEPARTMENT_OTHER): Payer: Self-pay | Admitting: Cardiology

## 2022-11-29 VITALS — BP 104/66 | HR 58 | Ht 65.0 in | Wt 124.1 lb

## 2022-11-29 DIAGNOSIS — I491 Atrial premature depolarization: Secondary | ICD-10-CM

## 2022-11-29 DIAGNOSIS — R42 Dizziness and giddiness: Secondary | ICD-10-CM

## 2022-11-29 DIAGNOSIS — Z7189 Other specified counseling: Secondary | ICD-10-CM

## 2022-11-29 DIAGNOSIS — I498 Other specified cardiac arrhythmias: Secondary | ICD-10-CM

## 2022-11-29 NOTE — Progress Notes (Signed)
Cardiology Office Note:    Date:  11/29/2022   ID:  Titana, Roling 1946-10-01, MRN [id number]  PCP:  Martinique, Betty G, MD  Cardiologist:  Buford Dresser, MD  Referring MD: Martinique, Betty G, MD   CC:  Irregular heart rate  History of Present Illness:    [name] is a 77 y.o. female with a hx of osteoarthritis, subclinical hypothyroidism, and varicose veins, who is seen as a new consult at the request of Martinique, Betty G, MD for the evaluation and management of irregular heart rate.  She was seen by Dr. Martinique 11/02/2022 and complained of intermittent dizziness and loss of balance. On auscultation, she was noted to have a mildly irregular heart rate. PAC's were noted on prior EKG's in 10/2019. They planned to consider cardiology evaluation if her dizziness became more frequent. She later requested a referral to cardiology.  Cardiovascular risk factors: Prior clinical ASCVD: None. Comorbid conditions: Subclinical hypothyroidism. Metabolic syndrome/Obesity: Current weight 124 lbs. Chronic inflammatory conditions: None Tobacco use history:  None. Family history:  Her mother and father both had heart issues. Her mother underwent surgery at 17 yo. Prior cardiac testing and/or incidental findings on other testing (ie coronary calcium):  She presented to the ED 10/2019 for a COVID infection. She states that her EKG reportedly noted irregularities.  Exercise level: She plays doubles tennis 1-2 times a week. She walks her dog. She works full time at the [employer]. She denies any physical limitations. Current diet: English muffins and butter for breakfast; sandwiches or salads for lunch; meat and potatoes for dinner. She does not have fried foods or have snacks. Usually has a chocolate dessert. She takes vitamin supplements, preceded lightheadedness.  About 6 months ago she began to experience sporadically intermittent episodes of lightheadedness and feeling "spacey."  The sensation lasts for 1-2 seconds before spontaneously resolving. There are no clear triggers or patterns that she has been able to determine, and the lightheadedness does not occur every day. These episodes have sometimes happened when she reaches the top of a flight of stairs, and when in the office after reaching above her head for something. She has never experienced this lightheadedness on the tennis court. No syncopal episodes or falls.  Additionally she receives therapeutic injections regarding her varicose veins. She does not believe there is any correlation with the lightheadedness.  She denies any chest pain, shortness of breath, or peripheral edema. No headaches, syncope, orthopnea, or PND.  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   No current facility-administered medications on file prior to visit.     Allergies:   Penicillins   Social History   Tobacco Use   Smoking status: Never   Smokeless tobacco: Never  Substance Use Topics   Alcohol use: Yes    Alcohol/week: 0.0 standard drinks of alcohol   Drug use: No    Family History: family history includes Arthritis in her mother; Cancer in her father; Heart disease in her father; Thyroid disease in  her mother.  ROS:   Please see the history of present illness.  Additional pertinent ROS: Constitutional: Negative for chills, fever, night sweats, unintentional weight loss  HENT: Negative for ear pain and hearing loss. Positive for lightheadedness.  Eyes: Negative for loss of  vision and eye pain.  Respiratory: Negative for cough, sputum, wheezing.   Cardiovascular: See HPI. Gastrointestinal: Negative for abdominal pain, melena, and hematochezia.  Genitourinary: Negative for dysuria and hematuria.  Musculoskeletal: Negative for falls and myalgias.  Skin: Negative for itching and rash.  Neurological: Negative for focal weakness, focal sensory changes and loss of consciousness.  Endo/Heme/Allergies: Does not bruise/bleed easily.     EKGs/Labs/Other Studies Reviewed:    The following studies were reviewed today:  Chest X-Ray  10/24/2019: FINDINGS: Normal heart size, mediastinal contours, and pulmonary vascularity.   Atherosclerotic calcification aorta.   Emphysematous changes and central peribronchial thickening consistent with COPD.   No acute infiltrate, pleural effusion, or pneumothorax.   Bones demineralized.   IMPRESSION: COPD changes without acute infiltrate.  EKG:  EKG is personally reviewed.   11/29/2022:  sinus bradycardia with PACs in atrial bigeminy, rate 58 bpm  Recent Labs: 03/16/2022: TSH 2.22 11/03/2022: BUN 23; Creatinine, Ser 1.01; Hemoglobin 14.3; Platelets 206.0; Potassium 4.2; Sodium 139   Recent Lipid Panel    Component Value Date/Time   CHOL 212 (H) 03/16/2022 0912   TRIG 78.0 03/16/2022 0912   HDL 82.40 03/16/2022 0912   CHOLHDL 3 03/16/2022 0912   VLDL 15.6 03/16/2022 0912   LDLCALC 114 (H) 03/16/2022 0912   LDLCALC 113 (H) 06/24/2020 0937   LDLDIRECT 114.5 11/14/2012 0954    Physical Exam:    VS:  BP 104/66 (BP Location: Right Arm, Patient Position: Sitting, Cuff Size: Normal)   Pulse (!) 58   Ht '5\' 5"'$  (1.651 m)   Wt 124 lb 1.6 oz (56.3 kg)   BMI 20.65 kg/m     Wt Readings from Last 3 Encounters:  11/29/22 124 lb 1.6 oz (56.3 kg)  11/02/22 123 lb 8 oz (56 kg)  03/29/22 125 lb (56.7 kg)    GEN: Well nourished, well developed in no acute distress HEENT: Normal, moist mucous membranes NECK: No JVD CARDIAC:  regular rhythm with one extra beat every 1-2 beats, normal S1 and S2, no rubs or gallops. No murmur. VASCULAR: Radial and DP pulses 2+ bilaterally. No carotid bruits RESPIRATORY:  Clear to auscultation without rales, wheezing or rhonchi  ABDOMEN: Soft, non-tender, non-distended MUSCULOSKELETAL:  Ambulates independently SKIN: Warm and dry, no edema NEUROLOGIC:  Alert and oriented x 3. No focal neuro deficits noted. PSYCHIATRIC:  Normal affect    ASSESSMENT:    1. Intermittent lightheadedness   2. PAC (premature atrial contraction)   3. Cardiac risk counseling   4. Atrial bigeminy    PLAN:    Intermittent lightheadedness -some of these events are with changing position, such as bending over and then standing up. For these, counseled on slow position changes, maintaining hydration. Many of the other events are very brief and without clear triggers. No high risk features suggesting cardiac etiology. No loss of consciousness -reviewed red flag warning signs that need immediate medical attention  PACS Atrial bigeminy -unlikely these are related to her symptoms -in atrial bigeminy today, asymptomatic  Cardiac risk counseling and prevention recommendations: -excellent diet/exercise -recommend avoidance of tobacco products. Avoid excess alcohol. -ASCVD risk score: The 10-year ASCVD risk score (Arnett DK, et al., 2019) is: 11.1%   Values used to  calculate the score:     Age: 51 years     Sex: Female     Is Non-Hispanic African American: No     Diabetic: No     Tobacco smoker: No     Systolic Blood Pressure: 123456 mmHg     Is BP treated: No     HDL Cholesterol: 82.4 mg/dL     Total Cholesterol: 212 mg/dL    Plan for follow up: As needed.  Buford Dresser, MD, PhD, [facility]    Medication Adjustments/Labs and Tests Ordered: Current medicines are reviewed at length with the patient today.  Concerns regarding medicines are outlined above.   Orders  Placed This Encounter  Procedures   EKG 12-Lead   No orders of the defined types were placed in this encounter.  Patient Instructions  Medication Instructions:  Your physician recommends that you continue on your current medications as directed. Please refer to the Current Medication list given to you today.   Labwork: NONE  Testing/Procedures: NONE  Follow-Up: AS NEEDED     I,Mathew Stumpf,acting as a Education administrator for [employer], MD.,have documented all relevant documentation on the behalf of Buford Dresser, MD,as directed by  Buford Dresser, MD while in the presence of Buford Dresser, MD.  I, Buford Dresser, MD, have reviewed all documentation for this visit. The documentation on 11/29/22 for the exam, diagnosis, procedures, and orders are all accurate and complete.   Signed, Buford Dresser, MD PhD 11/29/2022 6:06 PM    [place]

## 2022-11-29 NOTE — Patient Instructions (Signed)
Medication Instructions:  Your physician recommends that you continue on your current medications as directed. Please refer to the Current Medication list given to you today.   Labwork: NONE  Testing/Procedures: NONE  Follow-Up: AS NEEDED    

## 2022-11-30 DIAGNOSIS — M7989 Other specified soft tissue disorders: Secondary | ICD-10-CM | POA: Diagnosis not present

## 2022-11-30 DIAGNOSIS — I83891 Varicose veins of right lower extremities with other complications: Secondary | ICD-10-CM | POA: Diagnosis not present

## 2022-11-30 DIAGNOSIS — I83811 Varicose veins of right lower extremities with pain: Secondary | ICD-10-CM | POA: Diagnosis not present

## 2022-12-23 DIAGNOSIS — L810 Postinflammatory hyperpigmentation: Secondary | ICD-10-CM | POA: Diagnosis not present

## 2023-01-24 ENCOUNTER — Other Ambulatory Visit: Payer: Self-pay | Admitting: Family Medicine

## 2023-01-24 MED ORDER — ROSUVASTATIN CALCIUM 10 MG PO TABS: 10.0000 mg | ORAL_TABLET | ORAL | 2 refills | Status: DC

## 2023-01-24 NOTE — Telephone Encounter (Signed)
Prescription Request  01/24/2023  LOV: 11/02/2022  What is the name of the medication or equipment? rosuvastatin (CRESTOR) 10 MG tablet this will be new rx from dr Swaziland  Have you contacted your pharmacy to request a refill? No   Which pharmacy would you like this sent to?   [facility] [id number] - [place], [place] - [address] [place] [address] Phone: [phone] Fax: [phone]    Patient notified that their request is being sent to the clinical staff for review and that they should receive a response within 2 business days.   Please advise at Mobile [phone] (mobile)

## 2023-02-03 NOTE — Telephone Encounter (Signed)
Pt called to ask why she still hasn't received this refill?  Pt specified that she wanted to pick up this refill at [facility].  Pt was informed that Rx was mistakenly sent to [facility] on [address].  Pt will call them to FU.

## 2023-02-07 ENCOUNTER — Ambulatory Visit (INDEPENDENT_AMBULATORY_CARE_PROVIDER_SITE_OTHER): Payer: Medicare HMO | Admitting: Family Medicine

## 2023-02-07 ENCOUNTER — Encounter: Payer: Self-pay | Admitting: Family Medicine

## 2023-02-07 VITALS — BP 106/60 | HR 54 | Temp 98.4°F | Wt 119.4 lb

## 2023-02-07 DIAGNOSIS — R059 Cough, unspecified: Secondary | ICD-10-CM | POA: Diagnosis not present

## 2023-02-07 DIAGNOSIS — J40 Bronchitis, not specified as acute or chronic: Secondary | ICD-10-CM | POA: Diagnosis not present

## 2023-02-07 LAB — POC COVID19 BINAXNOW: SARS Coronavirus 2 Ag: NEGATIVE

## 2023-02-07 MED ORDER — AZITHROMYCIN 250 MG PO TABS: ORAL_TABLET | ORAL | 0 refills | Status: DC

## 2023-02-07 MED ORDER — BENZONATATE 200 MG PO CAPS: 200.0000 mg | ORAL_CAPSULE | Freq: Four times a day (QID) | ORAL | 1 refills | Status: DC | PRN

## 2023-02-07 NOTE — Progress Notes (Signed)
   Subjective:    Patient ID: [name], female    DOB: 1946/03/15, 77 y.o.   MRN: [id number]  HPI Here for 5 days of stuffy head, PND, chest congestion and a dry cough. No fever or SOB. Using Tylenol Cold and Flu.    Review of Systems  Constitutional: Negative.   HENT:  Positive for congestion, postnasal drip and voice change. Negative for ear pain, sinus pressure and sore throat.   Eyes: Negative.   Respiratory:  Positive for cough and chest tightness. Negative for shortness of breath and wheezing.        Objective:   Physical Exam Constitutional:      Appearance: Normal appearance.     Comments: Her voice is hoarse   HENT:     Right Ear: Tympanic membrane, ear canal and external ear normal.     Left Ear: Tympanic membrane, ear canal and external ear normal.     Nose: Nose normal.     Mouth/Throat:     Pharynx: Oropharynx is clear.  Eyes:     Conjunctiva/sclera: Conjunctivae normal.  Pulmonary:     Effort: Pulmonary effort is normal.     Breath sounds: Rhonchi present. No wheezing or rales.  Lymphadenopathy:     Cervical: No cervical adenopathy.  Neurological:     Mental Status: She is alert.           Assessment & Plan:  Bronchitis, treat with a Zpack. Use Benzonatate as needed.  Gershon Crane, MD

## 2023-02-08 DIAGNOSIS — J209 Acute bronchitis, unspecified: Secondary | ICD-10-CM | POA: Diagnosis not present

## 2023-02-09 DIAGNOSIS — R42 Dizziness and giddiness: Secondary | ICD-10-CM | POA: Diagnosis not present

## 2023-02-09 DIAGNOSIS — J209 Acute bronchitis, unspecified: Secondary | ICD-10-CM | POA: Diagnosis not present

## 2023-02-11 DIAGNOSIS — R42 Dizziness and giddiness: Secondary | ICD-10-CM | POA: Diagnosis not present

## 2023-02-11 DIAGNOSIS — J209 Acute bronchitis, unspecified: Secondary | ICD-10-CM | POA: Diagnosis not present

## 2023-02-19 DIAGNOSIS — J209 Acute bronchitis, unspecified: Secondary | ICD-10-CM | POA: Diagnosis not present

## 2023-02-21 ENCOUNTER — Telehealth: Payer: Self-pay | Admitting: Family Medicine

## 2023-02-21 NOTE — Telephone Encounter (Signed)
Pt saw Clent Ridges 02/07/23, still has some lingering symptoms. Asking if she would need additional meds or if she she needs to come back in.

## 2023-02-22 NOTE — Telephone Encounter (Signed)
Left a message for the patient to return my call.  

## 2023-02-22 NOTE — Telephone Encounter (Signed)
Call in Doxycycline 100 mg BID for 10 days  

## 2023-02-22 NOTE — Addendum Note (Signed)
Addended by: Christy Sartorius on: 02/22/2023 01:41 PM   Modules accepted: Orders

## 2023-02-23 DIAGNOSIS — Z79899 Other long term (current) drug therapy: Secondary | ICD-10-CM | POA: Diagnosis not present

## 2023-02-23 DIAGNOSIS — M810 Age-related osteoporosis without current pathological fracture: Secondary | ICD-10-CM | POA: Diagnosis not present

## 2023-02-23 DIAGNOSIS — E039 Hypothyroidism, unspecified: Secondary | ICD-10-CM | POA: Diagnosis not present

## 2023-02-23 DIAGNOSIS — E7800 Pure hypercholesterolemia, unspecified: Secondary | ICD-10-CM | POA: Diagnosis not present

## 2023-02-23 DIAGNOSIS — R5383 Other fatigue: Secondary | ICD-10-CM | POA: Diagnosis not present

## 2023-02-23 DIAGNOSIS — Z7289 Other problems related to lifestyle: Secondary | ICD-10-CM | POA: Diagnosis not present

## 2023-02-23 DIAGNOSIS — J209 Acute bronchitis, unspecified: Secondary | ICD-10-CM | POA: Diagnosis not present

## 2023-02-23 DIAGNOSIS — Z131 Encounter for screening for diabetes mellitus: Secondary | ICD-10-CM | POA: Diagnosis not present

## 2023-02-23 NOTE — Telephone Encounter (Signed)
Left a message for the patient to return my call.  

## 2023-02-24 DIAGNOSIS — J209 Acute bronchitis, unspecified: Secondary | ICD-10-CM | POA: Diagnosis not present

## 2023-02-24 MED ORDER — DOXYCYCLINE HYCLATE 100 MG PO TABS: 100.0000 mg | ORAL_TABLET | Freq: Two times a day (BID) | ORAL | 0 refills | Status: AC

## 2023-02-24 NOTE — Telephone Encounter (Signed)
Spoke with the patient and informed her the Rx was sent to [facility].

## 2023-02-24 NOTE — Addendum Note (Signed)
Addended by: Johnella Moloney on: 02/24/2023 08:11 AM   Modules accepted: Orders

## 2023-04-03 DIAGNOSIS — R42 Dizziness and giddiness: Secondary | ICD-10-CM | POA: Diagnosis not present

## 2023-04-04 DIAGNOSIS — R42 Dizziness and giddiness: Secondary | ICD-10-CM | POA: Diagnosis not present

## 2023-04-05 DIAGNOSIS — R42 Dizziness and giddiness: Secondary | ICD-10-CM | POA: Diagnosis not present

## 2023-04-16 DIAGNOSIS — R42 Dizziness and giddiness: Secondary | ICD-10-CM | POA: Diagnosis not present

## 2023-05-03 DIAGNOSIS — R42 Dizziness and giddiness: Secondary | ICD-10-CM | POA: Diagnosis not present

## 2023-05-04 DIAGNOSIS — R42 Dizziness and giddiness: Secondary | ICD-10-CM | POA: Diagnosis not present

## 2023-05-06 DIAGNOSIS — R42 Dizziness and giddiness: Secondary | ICD-10-CM | POA: Diagnosis not present

## 2023-05-07 DIAGNOSIS — R42 Dizziness and giddiness: Secondary | ICD-10-CM | POA: Diagnosis not present

## 2023-05-19 ENCOUNTER — Telehealth (INDEPENDENT_AMBULATORY_CARE_PROVIDER_SITE_OTHER): Payer: Medicare HMO | Admitting: Family Medicine

## 2023-05-19 DIAGNOSIS — Z0000 Encounter for general adult medical examination without abnormal findings: Secondary | ICD-10-CM

## 2023-05-19 NOTE — Progress Notes (Signed)
PATIENT CHECK-IN and HEALTH RISK ASSESSMENT QUESTIONNAIRE:  -completed by phone/video for upcoming Medicare Preventive Visit  Pre-Visit Check-in: 1)Vitals (height, wt, BP, etc) - record in vitals section for visit on day of visit Request home vitals (wt, BP, etc.) and enter into vitals, THEN update Vital Signs SmartPhrase below at the top of the HPI. See below.  2)Review and Update Medications, Allergies PMH, Surgeries, Social history in Epic 3)Hospitalizations in the last year with date/reason?  none  4)Review and Update Care Team (patient's specialists) in Epic 5) Complete PHQ9 in Epic  6) Complete Fall Screening in Epic 7)Review all Health Maintenance Due and order under PCP if not done.  8)Medicare Wellness Questionnaire: Answer theses question about your habits: Do you drink alcohol?  Wine, every night, 1 glass Have you ever smoked? never Do you use an illicit drugs? never Do you exercises? Plays tennis once per week, 1 mile walking daily, works event  Are you sexually active? 1 partner, no concerns Typical diet, 3 meals per day, tends to eat plenty of veggies, protein etc.  Answer theses question about you: Can you perform most household chores?yes Do you find it hard to follow a conversation in a noisy room? Had hearing evaluation but hearing aids did not work for her Do you often ask people to speak up or repeat themselves? Has some trouble but feels is ok Do you feel that you have a problem with memory? no Do you balance your checkbook and or bank acounts? yes Do you feel safe at home?yes Last dentist visit? Couple visits, every 6 months Do you need assistance with any of the following: Please note if so - none  Driving?  Feeding yourself?  Getting from bed to chair?  Getting to the toilet?  Bathing or showering?  Dressing yourself?  Managing money?  Climbing a flight of stairs  Preparing meals?  Do you have Advanced Directives in place (Living Will, Healthcare  Power or Attorney)?  yes   Last eye Exam and location? Goes once per year   Do you currently use prescribed or non-prescribed narcotic or opioid pain medications? none  Do you have a history or close family history of breast, ovarian, tubal or peritoneal cancer or a family member with BRCA (breast cancer susceptibility 1 and 2) gene mutations?     ----------------------------------------------------------------------------------------------------------------------------------------------------------------------------------------------------------------------  Vital Signs: Unable to obtain new vitals due to this being a telehealth visit.   MEDICARE ANNUAL PREVENTIVE VISIT WITH PROVIDER: (Welcome to Medicare, initial annual wellness or annual wellness exam)  Virtual Visit via Video Note  I connected with [name] on 05/19/23 by a video enabled telemedicine application and verified that I am speaking with the correct person using two identifiers.  Location patient: home Location provider:work or home office Persons participating in the virtual visit: patient, provider  Concerns and/or follow up today: none   See HM section in Epic for other details of completed HM.    ROS: negative for report of fevers, vision changes, vision loss, hearing loss or change, chest pain, sob, hemoptysis, melena, hematochezia, hematuria, falls, bleeding or bruising, thoughts of suicide or self harm, memory loss  Patient-completed extensive health risk assessment - reviewed and discussed with the patient: See Health Risk Assessment completed with patient prior to the visit either above or in recent phone note. This was reviewed in detailed with the patient today and appropriate recommendations, orders and referrals were placed as needed per Summary below and patient instructions.   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   Varicose veins     Past Surgical History:  Procedure Laterality Date   ABDOMINAL HYSTERECTOMY     APPENDECTOMY     COLECTOMY      Social History   Socioeconomic History   Marital status: Married    Spouse name: Not on file   Number of children: 1   Years of education: Not on file   Highest education level: Not on file  Occupational History   Occupation: coordinator  Tobacco Use   Smoking status: Never   Smokeless tobacco: Never  Substance and Sexual Activity   Alcohol use: Yes    Alcohol/week: 0.0 standard drinks of alcohol   Drug use: No   Sexual activity: Not on file  Other Topics Concern   Not on file  Social History Narrative   Not on file   Social Determinants of Health   Financial Resource Strain: Low Risk  (03/29/2022)   Overall Financial Resource Strain (CARDIA)    Difficulty of Paying Living Expenses: Not hard at all  Food Insecurity: No Food Insecurity (03/29/2022)   Hunger Vital Sign    Worried About Running Out of Food in the Last Year: Never true    Ran Out of Food in the Last Year: Never true  Transportation Needs: No Transportation Needs (03/29/2022)   PRAPARE - Administrator, Civil Service (Medical): No    Lack of Transportation (Non-Medical): No  Physical Activity: Sufficiently Active (03/29/2022)   Exercise Vital Sign    Days of Exercise per Week: 2 days    Minutes of Exercise per Session: 150+ min  Stress: No Stress Concern Present (03/29/2022)   [employer] of Occupational Health - Occupational Stress  Questionnaire    Feeling of Stress : Not at all  Social Connections: Unknown (03/29/2022)   Social Connection and Isolation Panel [NHANES]    Frequency of Communication with Friends and Family: More than three times a week    Frequency of Social Gatherings with Friends and Family: More than three times a week    Attends Religious Services: More than 4 times per year    Active Member of [employer] or Organizations: Yes    Attends Banker Meetings: More than 4 times per year    Marital Status: Not on file  Intimate Partner Violence: Not At Risk (03/29/2022)   Humiliation, Afraid, Rape, and Kick questionnaire    Fear of Current or Ex-Partner: No    Emotionally Abused: No    Physically Abused: No    Sexually Abused: No    Family History  Problem Relation Age of Onset   Thyroid disease Mother    Arthritis Mother    Cancer Father        lung ca , cad, smoker   Heart disease Father     Current Outpatient Medications on File Prior to Visit  Medication Sig Dispense Refill   azithromycin (ZITHROMAX Z-PAK) 250 MG tablet As directed 6 each 0   benzonatate (TESSALON) 200 MG capsule  Take 1 capsule (200 mg total) by mouth every 6 (six) hours as needed for cough. 40 capsule 1   rosuvastatin (CRESTOR) 10 MG tablet Take 1 tablet (10 mg total) by mouth 2 (two) times a week. Take 10 mg by mouth twice a week. 30 tablet 2   No current facility-administered medications on file prior to visit.    Allergies  Allergen Reactions   Penicillins Rash       Physical Exam Vitals requested from patient and listed below if patient had equipment and was able to obtain at home for this virtual visit: There were no vitals filed for this visit. Estimated body mass index is 19.87 kg/m as calculated from the following:   Height as of 11/29/22: 5\' 5"  (1.651 m).   Weight as of 02/07/23: 119 lb 6.4 oz (54.2 kg).  EKG (optional): deferred due to virtual visit  GENERAL: alert, oriented, no acute distress  detected, full vision exam deferred due to pandemic and/or virtual encounter  HEENT: atraumatic, conjunttiva clear, no obvious abnormalities on inspection of external nose and ears  NECK: normal movements of the head and neck  LUNGS: on inspection no signs of respiratory distress, breathing rate appears normal, no obvious gross SOB, gasping or wheezing  CV: no obvious cyanosis  MS: moves all visible extremities without noticeable abnormality  PSYCH/NEURO: pleasant and cooperative, no obvious depression or anxiety, speech and thought processing grossly intact, Cognitive function grossly intact  Flowsheet Row Office Visit from 11/02/2022 in [facility] [place]  PHQ-9 Total Score 1           05/19/2023    5:29 PM 11/02/2022    4:06 PM 03/29/2022    8:22 AM 03/16/2022    8:40 AM 06/27/2020   11:21 PM  Depression screen PHQ 2/9  Decreased Interest 0 0 0 0 0  Down, Depressed, Hopeless 0 0 0 0 0  PHQ - 2 Score 0 0 0 0 0  Altered sleeping  1 0 2   Tired, decreased energy  0 0 0   Change in appetite  0 0 0   Feeling bad or failure about yourself   0 0 0   Trouble concentrating  0 0 0   Moving slowly or fidgety/restless   0 0   Suicidal thoughts  0 0 0   PHQ-9 Score  1 0 2   Difficult doing work/chores  Not difficult at all Not difficult at all Not difficult at all        06/27/2020   11:20 PM 03/16/2022    8:40 AM 03/29/2022    8:25 AM 11/02/2022    4:05 PM 05/19/2023    5:30 PM  Fall Risk  Falls in the past year? 0 0 0 0 0  Was there an injury with Fall? 0 0 0 0 0  Fall Risk Category Calculator 0 0 0 0 0  Fall Risk Category (Retired) Low Low Low    (RETIRED) Patient Fall Risk Level Low fall risk Low fall risk Low fall risk    Patient at Risk for Falls Due to  No Fall Risks No Fall Risks Other (Comment) No Fall Risks  Fall risk Follow up Education provided Falls evaluation completed  Falls evaluation completed Education provided     SUMMARY AND  PLAN:  Encounter for Medicare annual wellness exam    Discussed applicable health maintenance/preventive health measures and advised and referred or ordered per patient preferences: -updated cologuard in  chart - not due for 2 more years -discussed vaccines due and where could get each Health Maintenance  Topic Date Due   COVID-19 Vaccine (2 - Moderna risk series) 12/13/2019   INFLUENZA VACCINE  05/05/2023   Zoster Vaccines- Shingrix (1 of 2) 10/08/2023 (Originally 10/16/1964)   MAMMOGRAM  10/15/2023   Medicare Annual Wellness (AWV)  05/18/2024   Fecal DNA (Cologuard)  03/30/2025   Pneumonia Vaccine 28+ Years old  Completed   DEXA SCAN  Completed   Hepatitis C Screening  Completed   HPV VACCINES  Aged Out   DTaP/Tdap/Td  Discontinued   Colonoscopy  Discontinued     Education and counseling on the following was provided based on the above review of health and a plan/checklist for the patient, along with additional information discussed, was provided for the patient in the patient instructions :     -Reviewed patient's current diet. Advised and counseled on a whole foods based healthy diet. A summary of a healthy diet was provided in the Patient Instructions.  -reviewed patient's current physical activity level and discussed exercise guidelines for adults. Discussed community resources and ideas for safe exercise at home to assist in meeting exercise guideline recommendations in a safe and healthy way.  -Advise yearly dental visits at minimum and regular eye exams -Advised and counseled on alcohol safe limits, risks   Follow up: see patient instructions     Patient Instructions  I really enjoyed getting to talk with you today! I am available on Tuesdays and Thursdays for virtual visits if you have any questions or concerns, or if I can be of any further assistance.   CHECKLIST FROM ANNUAL WELLNESS VISIT:  -Follow up (please call to schedule if not scheduled after  visit):   -yearly for annual wellness visit with primary care office  Here is a list of your preventive care/health maintenance measures and the plan for each if any are due:  PLAN For any measures below that may be due:   Health Maintenance  Topic Date Due   COVID-19 Vaccine (2 - Moderna risk series) 12/13/2019   INFLUENZA VACCINE  05/05/2023   Zoster Vaccines- Shingrix (1 of 2) 10/08/2023 (Originally 10/16/1964)   MAMMOGRAM  10/15/2023   Medicare Annual Wellness (AWV)  05/18/2024   Fecal DNA (Cologuard)  03/30/2025   Pneumonia Vaccine 17+ Years old  Completed   DEXA SCAN  Completed   Hepatitis C Screening  Completed   HPV VACCINES  Aged Out   DTaP/Tdap/Td  Discontinued   Colonoscopy  Discontinued    -See a dentist at least yearly  -Get your eyes checked and then per your eye specialist's recommendations  -Other issues addressed today:   -I have included below further information regarding a healthy whole foods based diet, physical activity guidelines for adults, stress management and opportunities for social connections. I hope you find this information useful.   -----------------------------------------------------------------------------------------------------------------------------------------------------------------------------------------------------------------------------------------------------------  NUTRITION: -eat real food: lots of colorful vegetables (half the plate) and fruits -5-7 servings of vegetables and fruits per day (fresh or steamed is best), exp. 2 servings of vegetables with lunch and dinner and 2 servings of fruit per day. Berries and greens such as kale and collards are great choices.  -consume on a regular basis: whole grains (make sure first ingredient on label contains the word "whole"), fresh fruits, fish, nuts, seeds, healthy oils (such as olive oil, avocado oil, grape seed oil) -may eat small amounts of dairy and lean meat on occasion, but  avoid processed meats such as ham, bacon, lunch meat, etc. -drink water -try to avoid fast food and pre-packaged foods, processed meat -most experts advise limiting sodium to < 2300mg  per day, should limit further is any chronic conditions such as high blood pressure, heart disease, diabetes, etc. The American Heart Association advised that < 1500mg  is is ideal -try to avoid foods that contain any ingredients with names you do not recognize  -try to avoid sugar/sweets (except for the natural sugar that occurs in fresh fruit) -try to avoid sweet drinks -try to avoid white rice, white bread, pasta (unless whole grain), white or yellow potatoes  EXERCISE GUIDELINES FOR ADULTS: -if you wish to increase your physical activity, do so gradually and with the approval of your doctor -STOP and seek medical care immediately if you have any chest pain, chest discomfort or trouble breathing when starting or increasing exercise  -move and stretch your body, legs, feet and arms when sitting for long periods -Physical activity guidelines for optimal health in adults: -least 150 minutes per week of aerobic exercise (can talk, but not sing) once approved by your doctor, 20-30 minutes of sustained activity or two 10 minute episodes of sustained activity every day.  -resistance training at least 2 days per week if approved by your doctor -balance exercises 3+ days per week:   Stand somewhere where you have something sturdy to hold onto if you lose balance.    1) lift up on toes, start with 5x per day and work up to 20x   2) stand and lift on leg straight out to the side so that foot is a few inches of the floor, start with 5x each side and work up to 20x each side   3) stand on one foot, start with 5 seconds each side and work up to 20 seconds on each side  If you need ideas or help with getting more active:  -Silver sneakers https://tools.silversneakers.com  -Walk with a  Doc: http://www.duncan-williams.com/  -try to include resistance (weight lifting/strength building) and balance exercises twice per week: or the following link for ideas: http://castillo-powell.com/  BuyDucts.dk  STRESS MANAGEMENT: -can try meditating, or just sitting quietly with deep breathing while intentionally relaxing all parts of your body for 5 minutes daily -if you need further help with stress, anxiety or depression please follow up with your primary doctor or contact the wonderful folks at [facility]: [phone]  SOCIAL CONNECTIONS: -options in [place] if you wish to engage in more social and exercise related activities:  -Silver sneakers https://tools.silversneakers.com  -Walk with a Doc: http://www.duncan-williams.com/  -Check out the [facility] Active Adults 50+ section on the [place] [employer] (hiking clubs, book clubs, cards and games, chess, exercise classes, aquatic classes and much more) - see the website for details: https://www.[place].gov/departments/parks-recreation/active-adults50  -YouTube has lots of exercise videos for different ages and abilities as well  -[facility] (a variety of indoor and outdoor inperson activities for adults). 8083206913. [address]  -Virtual Online Classes (a variety of topics): see seniorplanet.org or call [phone]  -consider volunteering at a school, hospice center, church, senior center or elsewhere           Terressa Koyanagi, DO

## 2023-05-19 NOTE — Patient Instructions (Signed)
I really enjoyed getting to talk with you today! I am available on Tuesdays and Thursdays for virtual visits if you have any questions or concerns, or if I can be of any further assistance.   CHECKLIST FROM ANNUAL WELLNESS VISIT:  -Follow up (please call to schedule if not scheduled after visit):   -yearly for annual wellness visit with primary care office  Here is a list of your preventive care/health maintenance measures and the plan for each if any are due:  PLAN For any measures below that may be due:   Health Maintenance  Topic Date Due   COVID-19 Vaccine (2 - Moderna risk series) 12/13/2019   INFLUENZA VACCINE  05/05/2023   Zoster Vaccines- Shingrix (1 of 2) 10/08/2023 (Originally 10/16/1964)   MAMMOGRAM  10/15/2023   Medicare Annual Wellness (AWV)  05/18/2024   Fecal DNA (Cologuard)  03/30/2025   Pneumonia Vaccine 70+ Years old  Completed   DEXA SCAN  Completed   Hepatitis C Screening  Completed   HPV VACCINES  Aged Out   DTaP/Tdap/Td  Discontinued   Colonoscopy  Discontinued    -See a dentist at least yearly  -Get your eyes checked and then per your eye specialist's recommendations  -Other issues addressed today:   -I have included below further information regarding a healthy whole foods based diet, physical activity guidelines for adults, stress management and opportunities for social connections. I hope you find this information useful.   -----------------------------------------------------------------------------------------------------------------------------------------------------------------------------------------------------------------------------------------------------------  NUTRITION: -eat real food: lots of colorful vegetables (half the plate) and fruits -5-7 servings of vegetables and fruits per day (fresh or steamed is best), exp. 2 servings of vegetables with lunch and dinner and 2 servings of fruit per day. Berries and greens such as kale and  collards are great choices.  -consume on a regular basis: whole grains (make sure first ingredient on label contains the word "whole"), fresh fruits, fish, nuts, seeds, healthy oils (such as olive oil, avocado oil, grape seed oil) -may eat small amounts of dairy and lean meat on occasion, but avoid processed meats such as ham, bacon, lunch meat, etc. -drink water -try to avoid fast food and pre-packaged foods, processed meat -most experts advise limiting sodium to < 2300mg  per day, should limit further is any chronic conditions such as high blood pressure, heart disease, diabetes, etc. The American Heart Association advised that < 1500mg  is is ideal -try to avoid foods that contain any ingredients with names you do not recognize  -try to avoid sugar/sweets (except for the natural sugar that occurs in fresh fruit) -try to avoid sweet drinks -try to avoid white rice, white bread, pasta (unless whole grain), white or yellow potatoes  EXERCISE GUIDELINES FOR ADULTS: -if you wish to increase your physical activity, do so gradually and with the approval of your doctor -STOP and seek medical care immediately if you have any chest pain, chest discomfort or trouble breathing when starting or increasing exercise  -move and stretch your body, legs, feet and arms when sitting for long periods -Physical activity guidelines for optimal health in adults: -least 150 minutes per week of aerobic exercise (can talk, but not sing) once approved by your doctor, 20-30 minutes of sustained activity or two 10 minute episodes of sustained activity every day.  -resistance training at least 2 days per week if approved by your doctor -balance exercises 3+ days per week:   Stand somewhere where you have something sturdy to hold onto if you lose balance.  1) lift up on toes, start with 5x per day and work up to 20x   2) stand and lift on leg straight out to the side so that foot is a few inches of the floor, start with 5x  each side and work up to 20x each side   3) stand on one foot, start with 5 seconds each side and work up to 20 seconds on each side  If you need ideas or help with getting more active:  -Silver sneakers https://tools.silversneakers.com  -Walk with a Doc: http://www.duncan-williams.com/  -try to include resistance (weight lifting/strength building) and balance exercises twice per week: or the following link for ideas: http://castillo-powell.com/  BuyDucts.dk  STRESS MANAGEMENT: -can try meditating, or just sitting quietly with deep breathing while intentionally relaxing all parts of your body for 5 minutes daily -if you need further help with stress, anxiety or depression please follow up with your primary doctor or contact the wonderful folks at [facility]: [phone]  SOCIAL CONNECTIONS: -options in [place] if you wish to engage in more social and exercise related activities:  -Silver sneakers https://tools.silversneakers.com  -Walk with a Doc: http://www.duncan-williams.com/  -Check out the [facility] Active Adults 50+ section on the [place] of [employer] (hiking clubs, book clubs, cards and games, chess, exercise classes, aquatic classes and much more) - see the website for details: https://www.[place].gov/departments/parks-recreation/active-adults50  -YouTube has lots of exercise videos for different ages and abilities as well  -[facility] (a variety of indoor and outdoor inperson activities for adults). [phone]. [address]  -Virtual Online Classes (a variety of topics): see seniorplanet.org or call (430)388-8639  -consider volunteering at a school, hospice center, church, senior center or elsewhere

## 2023-06-08 ENCOUNTER — Encounter: Payer: Self-pay | Admitting: Family Medicine

## 2023-06-08 ENCOUNTER — Ambulatory Visit (INDEPENDENT_AMBULATORY_CARE_PROVIDER_SITE_OTHER): Payer: Medicare HMO | Admitting: Family Medicine

## 2023-06-08 VITALS — BP 98/60 | HR 70 | Temp 98.1°F | Resp 16 | Ht 65.0 in | Wt 118.2 lb

## 2023-06-08 DIAGNOSIS — H8110 Benign paroxysmal vertigo, unspecified ear: Secondary | ICD-10-CM | POA: Diagnosis not present

## 2023-06-08 DIAGNOSIS — L723 Sebaceous cyst: Secondary | ICD-10-CM

## 2023-06-08 DIAGNOSIS — E038 Other specified hypothyroidism: Secondary | ICD-10-CM | POA: Diagnosis not present

## 2023-06-08 DIAGNOSIS — E785 Hyperlipidemia, unspecified: Secondary | ICD-10-CM

## 2023-06-08 NOTE — Progress Notes (Unsigned)
HPI:  [name] is a 77 y.o. female, who is here today to follow on recent urgent care visit.  Review of Systems See other pertinent positives and negatives in HPI.  Current Outpatient Medications on File Prior to Visit  Medication Sig Dispense Refill   rosuvastatin (CRESTOR) 10 MG tablet Take 1 tablet (10 mg total) by mouth 2 (two) times a week. Take 10 mg by mouth twice a week. 30 tablet 2   No current facility-administered medications on file prior to visit.    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   Varicose veins    Allergies  Allergen Reactions   Penicillins Rash    Social History   Socioeconomic History   Marital status: Married    Spouse name: Not on file   Number of children: 1   Years of education: Not on file   Highest education level: Not on file  Occupational History   Occupation: coordinator  Tobacco Use   Smoking status: Never   Smokeless tobacco: Never  Substance and Sexual Activity   Alcohol use: Yes    Alcohol/week: 0.0 standard drinks of alcohol   Drug use: No   Sexual activity: Not on file  Other Topics Concern   Not on file  Social History Narrative   Not on file   Social Determinants of Health   Financial Resource Strain: Low Risk  (03/29/2022)   Overall Financial Resource Strain (CARDIA)    Difficulty of Paying Living Expenses: Not hard at all  Food Insecurity: No Food Insecurity (03/29/2022)   Hunger Vital Sign    Worried About Running Out of Food in the Last Year: Never true    Ran Out of Food in the Last Year: Never true  Transportation Needs: No Transportation Needs (03/29/2022)   PRAPARE - Administrator, Civil Service (Medical): No    Lack of Transportation (Non-Medical): No  Physical Activity: Sufficiently Active (03/29/2022)   Exercise Vital Sign    Days  of Exercise per Week: 2 days    Minutes of Exercise per Session: 150+ min  Stress: No Stress Concern Present (03/29/2022)   [employer] of Occupational Health - Occupational Stress Questionnaire    Feeling of Stress : Not at all  Social Connections: Unknown (03/29/2022)   Social Connection and Isolation Panel [NHANES]    Frequency of Communication with Friends and Family: More than three times a week    Frequency of Social Gatherings with Friends and Family: More than three times a week    Attends Religious Services: More than 4 times per year    Active Member of [employer] or Organizations: Yes    Attends Banker Meetings: More than 4 times per year    Marital Status: Not on file    Vitals:   06/08/23 0918  BP: 98/60  Pulse: 70  Resp: 16  Temp: 98.1 F (36.7 C)  SpO2: 98%   Wt Readings from Last 3 Encounters:  06/08/23 118 lb 4 oz (53.6 kg)  02/07/23 119 lb 6.4 oz (54.2 kg)  11/29/22 124 lb 1.6 oz (56.3 kg)    Body mass index is 19.68 kg/m.  Physical Exam HENT:     Head:      ASSESSMENT AND PLAN:  There are no diagnoses linked to this encounter.  No orders of the defined  types were placed in this encounter.  Increase protai inta and cons ade hydr*** #1 more prom , #2 palpable, No problem-specific Assessment & Plan notes found for this encounter.   No follow-ups on file.  Oneida Mckamey G. [place], MD  [facility]. [place] office.

## 2023-06-08 NOTE — Patient Instructions (Addendum)
A few things to remember from today's visit:  Benign paroxysmal positional vertigo, unspecified laterality - Plan: Ambulatory referral to ENT  Sebaceous cyst  Subclinical hypothyroidism - Plan: TSH  Hyperlipidemia, unspecified hyperlipidemia type - Plan: Comprehensive metabolic panel, Lipid panel  Please arrange appt for fasting labs. If taking biotin , stop 2 weeks before labs.  If you need refills for medications you take chronically, please call your pharmacy. Do not use My Chart to request refills or for acute issues that need immediate attention. If you send a my chart message, it may take a few days to be addressed, specially if I am not in the office.  Please be sure medication list is accurate. If a new problem present, please set up appointment sooner than planned today.

## 2023-06-09 NOTE — Assessment & Plan Note (Signed)
Last TSH normal at 2.22 in 03/2022. She is not on hormonal therapy. TSH to be added to next blood work.

## 2023-06-09 NOTE — Assessment & Plan Note (Signed)
On occipital scalp. Already evaluated by dermatologist. She does not want to have to shave in order to have lesions excised. Asymptomatic , so she prefers to hold on surgical procedure.

## 2023-06-09 NOTE — Assessment & Plan Note (Signed)
LDL 114 in 03/2022 and non-HDL 130. Continue Rosuvastatin 10 mg daily and low fat diet. She is not fasting today, so will arrange lab appt. Further recommendations will be given according to lipid panel result.

## 2023-06-09 NOTE — Addendum Note (Signed)
Addended by: [place], Harshith Pursell G on: 06/09/2023 04:55 PM   Modules accepted: Level of Service

## 2023-06-13 ENCOUNTER — Other Ambulatory Visit (INDEPENDENT_AMBULATORY_CARE_PROVIDER_SITE_OTHER): Payer: Medicare HMO

## 2023-06-13 DIAGNOSIS — E038 Other specified hypothyroidism: Secondary | ICD-10-CM

## 2023-06-13 DIAGNOSIS — E785 Hyperlipidemia, unspecified: Secondary | ICD-10-CM | POA: Diagnosis not present

## 2023-06-13 LAB — COMPREHENSIVE METABOLIC PANEL
ALT: 15 U/L (ref 0–35)
AST: 19 U/L (ref 0–37)
Albumin: 4 g/dL (ref 3.5–5.2)
Alkaline Phosphatase: 42 U/L (ref 39–117)
BUN: 17 mg/dL (ref 6–23)
CO2: 29 meq/L (ref 19–32)
Calcium: 9.2 mg/dL (ref 8.4–10.5)
Chloride: 105 meq/L (ref 96–112)
Creatinine, Ser: 0.98 mg/dL (ref 0.40–1.20)
GFR: 56.4 mL/min — ABNORMAL LOW (ref >60.00)
Glucose, Bld: 87 mg/dL (ref 70–99)
Potassium: 3.9 meq/L (ref 3.5–5.1)
Sodium: 141 meq/L (ref 135–145)
Total Bilirubin: 0.8 mg/dL (ref 0.2–1.2)
Total Protein: 6.9 g/dL (ref 6.0–8.3)

## 2023-06-13 LAB — LIPID PANEL
Cholesterol: 150 mg/dL (ref 0–200)
HDL: 72 mg/dL (ref >39.00)
LDL Cholesterol: 66 mg/dL (ref 0–99)
NonHDL: 77.85
Total CHOL/HDL Ratio: 2
Triglycerides: 57 mg/dL (ref 0.0–149.0)
VLDL: 11.4 mg/dL (ref 0.0–40.0)

## 2023-06-13 LAB — TSH: TSH: 3.83 u[IU]/mL (ref 0.35–5.50)

## 2023-07-29 DIAGNOSIS — L299 Pruritus, unspecified: Secondary | ICD-10-CM | POA: Diagnosis not present

## 2023-07-29 DIAGNOSIS — L819 Disorder of pigmentation, unspecified: Secondary | ICD-10-CM | POA: Diagnosis not present

## 2023-07-29 DIAGNOSIS — I8311 Varicose veins of right lower extremity with inflammation: Secondary | ICD-10-CM | POA: Diagnosis not present

## 2023-07-29 DIAGNOSIS — I83893 Varicose veins of bilateral lower extremities with other complications: Secondary | ICD-10-CM | POA: Diagnosis not present

## 2023-07-29 DIAGNOSIS — I8312 Varicose veins of left lower extremity with inflammation: Secondary | ICD-10-CM | POA: Diagnosis not present

## 2023-09-23 ENCOUNTER — Other Ambulatory Visit: Payer: Self-pay | Admitting: Family Medicine

## 2023-09-29 DIAGNOSIS — H2513 Age-related nuclear cataract, bilateral: Secondary | ICD-10-CM | POA: Diagnosis not present

## 2023-09-29 DIAGNOSIS — H5203 Hypermetropia, bilateral: Secondary | ICD-10-CM | POA: Diagnosis not present

## 2023-09-29 DIAGNOSIS — D23112 Other benign neoplasm of skin of right lower eyelid, including canthus: Secondary | ICD-10-CM | POA: Diagnosis not present

## 2023-10-27 DIAGNOSIS — R42 Dizziness and giddiness: Secondary | ICD-10-CM | POA: Diagnosis not present

## 2023-10-29 DIAGNOSIS — R42 Dizziness and giddiness: Secondary | ICD-10-CM | POA: Diagnosis not present

## 2023-11-16 ENCOUNTER — Other Ambulatory Visit: Payer: Self-pay | Admitting: Family Medicine

## 2023-11-16 DIAGNOSIS — Z1231 Encounter for screening mammogram for malignant neoplasm of breast: Secondary | ICD-10-CM

## 2023-11-23 ENCOUNTER — Ambulatory Visit: Payer: Medicare HMO

## 2023-12-06 DIAGNOSIS — H9193 Unspecified hearing loss, bilateral: Secondary | ICD-10-CM | POA: Diagnosis not present

## 2023-12-06 DIAGNOSIS — R42 Dizziness and giddiness: Secondary | ICD-10-CM | POA: Diagnosis not present

## 2023-12-08 ENCOUNTER — Ambulatory Visit
Admission: RE | Admit: 2023-12-08 | Discharge: 2023-12-08 | Disposition: A | Discharge status: Home / Self Care | Source: Ambulatory Visit | Attending: Family Medicine | Admitting: Family Medicine

## 2023-12-08 DIAGNOSIS — Z1231 Encounter for screening mammogram for malignant neoplasm of breast: Secondary | ICD-10-CM

## 2023-12-15 DIAGNOSIS — L720 Epidermal cyst: Secondary | ICD-10-CM | POA: Diagnosis not present

## 2023-12-15 DIAGNOSIS — L821 Other seborrheic keratosis: Secondary | ICD-10-CM | POA: Diagnosis not present

## 2023-12-15 DIAGNOSIS — L7211 Pilar cyst: Secondary | ICD-10-CM | POA: Diagnosis not present

## 2023-12-15 DIAGNOSIS — D1801 Hemangioma of skin and subcutaneous tissue: Secondary | ICD-10-CM | POA: Diagnosis not present

## 2023-12-26 DIAGNOSIS — D485 Neoplasm of uncertain behavior of skin: Secondary | ICD-10-CM | POA: Diagnosis not present

## 2023-12-26 DIAGNOSIS — L7211 Pilar cyst: Secondary | ICD-10-CM | POA: Diagnosis not present

## 2023-12-29 DIAGNOSIS — J209 Acute bronchitis, unspecified: Secondary | ICD-10-CM | POA: Diagnosis not present

## 2023-12-30 DIAGNOSIS — J209 Acute bronchitis, unspecified: Secondary | ICD-10-CM | POA: Diagnosis not present

## 2023-12-31 DIAGNOSIS — J209 Acute bronchitis, unspecified: Secondary | ICD-10-CM | POA: Diagnosis not present

## 2024-01-07 DIAGNOSIS — J209 Acute bronchitis, unspecified: Secondary | ICD-10-CM | POA: Diagnosis not present

## 2024-01-08 DIAGNOSIS — J209 Acute bronchitis, unspecified: Secondary | ICD-10-CM | POA: Diagnosis not present

## 2024-01-27 DIAGNOSIS — M5459 Other low back pain: Secondary | ICD-10-CM | POA: Diagnosis not present

## 2024-01-28 DIAGNOSIS — R42 Dizziness and giddiness: Secondary | ICD-10-CM | POA: Diagnosis not present

## 2024-01-30 DIAGNOSIS — H903 Sensorineural hearing loss, bilateral: Secondary | ICD-10-CM | POA: Diagnosis not present

## 2024-02-06 ENCOUNTER — Other Ambulatory Visit: Payer: Self-pay

## 2024-02-06 ENCOUNTER — Encounter: Payer: Self-pay | Admitting: Physical Therapy

## 2024-02-06 ENCOUNTER — Ambulatory Visit: Attending: Physician Assistant | Admitting: Physical Therapy

## 2024-02-06 VITALS — BP 107/64 | HR 64

## 2024-02-06 DIAGNOSIS — R42 Dizziness and giddiness: Secondary | ICD-10-CM | POA: Insufficient documentation

## 2024-02-06 NOTE — Therapy (Signed)
 OUTPATIENT PHYSICAL THERAPY VESTIBULAR EVALUATION     Patient Name: [name] MRN: [id number] DOB:02/14/1946, 78 y.o., female Today's Date: 02/06/2024  END OF SESSION:  PT End of Session - 02/06/24 0849     Visit Number 1    Number of Visits 4   only scheduled 2 visit past eval as suspect symptom resolved   Date for PT Re-Evaluation 02/27/24    Authorization Type [employer]    PT Start Time 0848    PT Stop Time 0930    PT Time Calculation (min) 42 min    Equipment Utilized During Treatment Other (comment)   mat level session   Activity Tolerance Patient tolerated treatment well    Behavior During Therapy [facility] for tasks assessed/performed             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   Varicose veins    Past Surgical History:  Procedure Laterality Date   ABDOMINAL HYSTERECTOMY     APPENDECTOMY     COLECTOMY     Patient Active Problem List   Diagnosis Date Noted   Hyperlipidemia 06/08/2023   Sebaceous cyst 06/08/2023   Acute medial meniscus tear of right knee 07/31/2019   Raynaud phenomenon 10/20/2018   Trigger thumb of left hand 06/14/2018   Arthritis of carpometacarpal (CMC) joint of both thumbs 06/14/2018   Subclinical hypothyroidism 04/04/2018   Hot flash not due to menopause 04/04/2018   Acute medial meniscal tear, left, initial encounter 10/14/2017   Counseling for estrogen replacement therapy 12/23/2015   Environmental and seasonal allergies 12/23/2015   H/O right hemicolectomy 12/23/2015   Varicose veins of leg with complications 03/18/2015    PCP: Jeppie Moles, MD REFERRING PROVIDER: Denette Finner, PA-C  REFERRING DIAG: [phone] (ICD-10-CM) - Benign paroxysmal vertigo, left ear  THERAPY DIAG:  Dizziness and giddiness - Plan: PT plan of care cert/re-cert  ONSET DATE: 02/01/2024  (referral date)  Rationale for Evaluation and Treatment: Rehabilitation  SUBJECTIVE:   SUBJECTIVE STATEMENT: Patient reports that she onset of vertigo when she would lay down in bed that started about 3-4 months ago. She had her first episode when she went to pick up her dog. Patient reports on her fist episode she would always go to the L. She reports that her symptoms always happen going to the L and when she first wakes. Patient reports that she needs bilateral hearing loss but was not told one ear was worse than the other. Patient reports no changes to hearing at time of first episode. Patient reports the feeling typically lasts for 30 seconds. Patient reports taking an anti-nausea medication as she sometimes feels nauseous with testing.   Pt accompanied by: self  PERTINENT HISTORY: patient had ovarian cancer ~20 years ago, osteoporosis   PAIN:  Are you having pain? No  PRECAUTIONS: Fall  RED FLAGS: None   WEIGHT BEARING RESTRICTIONS: No  FALLS: Has patient fallen in last 6 months? No  LIVING ENVIRONMENT: Lives with: lives alone with dog Lives in: House/apartment Stairs: Yes: Internal: 15 steps; on left going up and External: 2 steps; none Has following equipment at home: Grab bars  PLOF: Independent - works full time at [employer]   PATIENT GOALS: "Get rid of this! Put the crystals back."   OBJECTIVE:  Note: Objective measures were completed at Evaluation unless otherwise noted.  DIAGNOSTIC FINDINGS: No relevant updated imaging   COGNITION: Overall cognitive status: Within functional limits for tasks assessed   SENSATION: None  Cervical ROM:    Active A/PROM (deg) eval  Flexion WFL  Extension 70% ROM  Right lateral flexion WFL  Left lateral flexion WFL  Right rotation WFL  Left rotation WFL  (Blank rows = not tested)  VESTIBULAR ASSESSMENT:  GENERAL OBSERVATION: contacts - bifocals   SYMPTOM BEHAVIOR:  Subjective history: see  above  Non-Vestibular symptoms: nausea/vomiting and reports chronic changes in hearing Type of dizziness: Imbalance (Disequilibrium), Spinning/Vertigo, Unsteady with head/body turns, "Funny feeling in the head", "World moves", and "Swimmyheaded"  Frequency: morning when I get up or when I take a nap  Duration: 30 seconds   Aggravating factors:  getting out of bed in the morning and laying down in bed  Relieving factors: slow movements  Progression of symptoms: better  OCULOMOTOR EXAM:  Ocular Alignment: normal  Ocular ROM: No Limitations  Spontaneous Nystagmus: absent  Gaze-Induced Nystagmus: absent  Smooth Pursuits:  very mild saccades but near normal   Saccades: intact  Convergence/Divergence: < 5 cm  VBI: negative bilaterally   Cover Uncover: WFL  VESTIBULAR - OCULAR REFLEX:   Slow VOR: Normal  VOR Cancellation: Normal  Head-Impulse Test: HIT Right: negative HIT Left: negative  Dynamic Visual Acuity: Not captured on eval   POSITIONAL TESTING:  Right Roll Test: no nystagmus Left Roll Test: reports brief dizziness with head turned to L and few beats of nystagmus that diminish quickly with head turns to the L Right Sidelying: no nystagmus Left Sidelying: upbeating, left nystagmus  Performed modified sidelying given history of osteoporosis  MOTION SENSITIVITY:  Motion Sensitivity Quotient Intensity: 0 = none, 1 = Lightheaded, 2 = Mild, 3 = Moderate, 4 = Severe, 5 = Vomiting  Intensity  1. Sitting to supine   2. Supine to L side   3. Supine to R side   4. Supine to sitting   5. L Hallpike-Dix   6. Up from L    7. R Hallpike-Dix   8. Up from R    9. Sitting, head tipped to L knee 0  10. Head up from L knee 0  11. Sitting, head tipped to R knee 0  12. Head up from R knee 0  13. Sitting head turns x5 0  14.Sitting head nods x5 0  15. In stance, 180 turn to L    16. In stance, 180 turn to R     VITALS:     02/06/2024    9:07 AM 06/08/2023    9:18 AM 02/07/2023    11:07 AM  Vitals with BMI  Height  5\' 5"    Weight  118 lbs 4 oz 119 lbs 6 oz  BMI  19.68   Systolic 107 98 106  Diastolic 64 60 60  Pulse 64 70 54  TREATMENT:    Canalith Repositioning: Semont Left Posterior: Number of Reps: 1, Response to Treatment: symptoms resolved, and Comment: reported symptoms in position 1 and 2 with anticipated nystagmus noted in both positions, performed with 1 therapist and PT educating patient through motion, retested with sidelying 1x with no reoccurence of symptoms, initial symptoms with L torsional upbeat in L sidelying testing with delay ~3 seconds and duration ~10 seconds    Self Care: Educated patient extensively on etiology of BPPV and presentation of symptoms, showed vestibular model and explained how positional maneuvers work, discussed POC, educated on not avoiding movement patterns   PATIENT EDUCATION: Education details: POC, goal collaboration, examination findings, self care session above Person educated: Patient Education method: Explanation Education comprehension: verbalized understanding  HOME EXERCISE PROGRAM:  To be provided   GOALS: Goals reviewed with patient? Yes  LONG TERM GOALS: Target date: 02/27/2024  Patient will demonstrate clear positional testing to demonstrate resolution of BPPV. Baseline: Positive L Sidelying for L posterior canal canalithiasis - may have fully cleared on eval but recommend retesting  Goal status: INITIAL  ASSESSMENT:  CLINICAL IMPRESSION: Patient is a 78 y.o. female who was seen today for physical therapy evaluation and treatment for L posterior canal BPPV. Patient noted to have slight facial asymmetry with L lip droop on L but no other indictors of central findings and anticipate likely incidental finding. Patient with PMH including osteoporosis so performed modified sidelying  positional testing consistent with L posterior canalithiasis treated 1x with Semont maneuver that appears to have resolved. Recommend extending POC to ensure full resolution and if so may need early D/C. Patient agreeable to POC.  OBJECTIVE IMPAIRMENTS: decreased balance and dizziness.   ACTIVITY LIMITATIONS: bed mobility  PARTICIPATION LIMITATIONS: community activity  PERSONAL FACTORS: Age, Sex, and 1 comorbidity: see above  are also affecting patient's functional outcome.   REHAB POTENTIAL: Good  CLINICAL DECISION MAKING: Stable/uncomplicated  EVALUATION COMPLEXITY: Low   PLAN:  PT FREQUENCY: 1-2x/week  PT DURATION: 2 weeks  PLANNED INTERVENTIONS: 97164- PT Re-evaluation, 97110-Therapeutic exercises, 97530- Therapeutic activity, [id number]- Neuromuscular re-education, 97535- Self Care, 91478- Manual therapy, [id number]- Gait training, and [phone]- Canalith repositioning  PLAN FOR NEXT SESSION: reassess positional testing (modified given osteoporosis)   Coreen Devoid, PT 02/06/2024, 11:12 AM

## 2024-02-13 ENCOUNTER — Ambulatory Visit: Admitting: Physical Therapy

## 2024-02-16 ENCOUNTER — Ambulatory Visit

## 2024-05-21 DIAGNOSIS — L820 Inflamed seborrheic keratosis: Secondary | ICD-10-CM | POA: Diagnosis not present

## 2024-05-21 DIAGNOSIS — L720 Epidermal cyst: Secondary | ICD-10-CM | POA: Diagnosis not present

## 2024-06-11 DIAGNOSIS — J209 Acute bronchitis, unspecified: Secondary | ICD-10-CM | POA: Diagnosis not present

## 2024-06-11 DIAGNOSIS — L820 Inflamed seborrheic keratosis: Secondary | ICD-10-CM | POA: Diagnosis not present

## 2024-06-11 DIAGNOSIS — L720 Epidermal cyst: Secondary | ICD-10-CM | POA: Diagnosis not present

## 2024-06-12 DIAGNOSIS — J209 Acute bronchitis, unspecified: Secondary | ICD-10-CM | POA: Diagnosis not present

## 2024-06-21 ENCOUNTER — Ambulatory Visit (INDEPENDENT_AMBULATORY_CARE_PROVIDER_SITE_OTHER): Admitting: Family Medicine

## 2024-06-21 DIAGNOSIS — Z0000 Encounter for general adult medical examination without abnormal findings: Secondary | ICD-10-CM | POA: Diagnosis not present

## 2024-06-21 DIAGNOSIS — E2839 Other primary ovarian failure: Secondary | ICD-10-CM

## 2024-06-21 NOTE — Progress Notes (Signed)
 PATIENT CHECK-IN and HEALTH RISK ASSESSMENT QUESTIONNAIRE:  -completed by phone/video for  Medicare Preventive Visit  Pre-Visit Check-in: 1)Vitals (height, wt, BP, etc) - record in vitals section for visit on day of visit Request home vitals (wt, BP, etc.) and enter into vitals, THEN update Vital Signs SmartPhrase below at the top of the HPI. See below.  2)Review and Update Medications, Allergies PMH, Surgeries, Social history in Epic 3)Hospitalizations in the last year with date/reason? n  4)Review and Update Care Team (patient's specialists) in Epic 5) Complete PHQ9 in Epic  6) Complete Fall Screening in Epic 7)Review all Health Maintenance Due and order if not done.  Medicare Wellness Patient Questionnaire:  Answer theses question about your habits: How often do you have a drink containing alcohol ?daily How many drinks containing alcohol  do you have on a typical day when you are drinking?1 How often do you have six or more drinks on one occasion?n Have you ever smoked?n Quit date if applicable? na  How many packs a day do/did you smoke? na Do you use smokeless tobacco?n Do you use an illicit drugs?n On average, how many days per week do you engage in moderate to strenuous exercise (like a brisk walk)?daily On average, how many minutes do you engage in exercise at this level?walking, tennis Typical diet: proteins, veggies, fruits, whole grains  Beverages: water, diet coke  Answer theses question about your everyday activities: Can you perform most household chores?y Are you deaf or have significant trouble hearing?has had hearing eval in the past - now has hearing aids, work ok Do you feel that you have a problem with memory?n Do you feel safe at home?y Last dentist visit?goes every 6 months 8. Do you have any difficulty performing your everyday activities?n Are you having any difficulty walking, taking medications on your own, and or difficulty managing daily home needs?n Do  you have difficulty walking or climbing stairs?n Do you have difficulty dressing or bathing?n Do you have difficulty doing errands alone such as visiting a doctor's office or shopping?n Do you currently have any difficulty preparing food and eating?n Do you currently have any difficulty using the toilet?n Do you have any difficulty managing your finances?n Do you have any difficulties with housekeeping of managing your housekeeping?n   Do you have Advanced Directives in place (Living Will, Healthcare Power or Attorney)? y   Last eye Exam and location?goes yearly   Do you currently use prescribed or non-prescribed narcotic or opioid pain medications?n  Do you have a history or close family history of breast, ovarian, tubal or peritoneal cancer or a family member with BRCA (breast cancer susceptibility 1 and 2) gene mutations?see pmh/fh    ----------------------------------------------------------------------------------------------------------------------------------------------------------------------------------------------------------------------  Because this visit was a virtual/telehealth visit, some criteria may be missing or patient reported. Any vitals not documented were not able to be obtained and vitals that have been documented are patient reported.    MEDICARE ANNUAL PREVENTIVE VISIT WITH PROVIDER: (Welcome to Medicare, initial annual wellness or annual wellness exam)  Virtual Visit via Video Note  I connected with [name] on 06/21/24 by a video enabled telemedicine application and verified that I am speaking with the correct person using two identifiers.  Location patient: home Location provider:work or home office Persons participating in the virtual visit: patient, provider  Concerns and/or follow up today: no concerns   See HM section in Epic for other details of completed HM.    ROS: negative for report of fevers, unintentional weight  loss,  vision changes, vision loss, hearing loss or change, chest pain, sob, hemoptysis, melena, hematochezia, hematuria, falls, bleeding or bruising, thoughts of suicide or self harm, memory loss  Patient-completed extensive health risk assessment - reviewed and discussed with the patient: See Health Risk Assessment completed with patient prior to the visit either above or in recent phone note. This was reviewed in detailed with the patient today and appropriate recommendations, orders and referrals were placed as needed per Summary below and patient instructions.   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   Varicose veins     Past Surgical History:  Procedure Laterality Date   ABDOMINAL HYSTERECTOMY     APPENDECTOMY     COLECTOMY      Social History   Socioeconomic History   Marital status: Married    Spouse name: Not on file   Number of children: 1   Years of education: Not on file   Highest education level: Not on file  Occupational History   Occupation: coordinator  Tobacco Use   Smoking status: Never   Smokeless tobacco: Never  Substance and Sexual Activity   Alcohol  use: Yes    Alcohol /week: 0.0 standard drinks of alcohol    Drug use: No   Sexual activity: Not on file  Other Topics Concern   Not on file  Social History Narrative   Not on file   Social Drivers of Health   Financial Resource Strain: Low Risk  (03/29/2022)   Overall Financial Resource Strain (CARDIA)    Difficulty of Paying Living  Expenses: Not hard at all  Food Insecurity: No Food Insecurity (03/29/2022)   Hunger Vital Sign    Worried About Running Out of Food in the Last Year: Never true    Ran Out of Food in the Last Year: Never true  Transportation Needs: No Transportation Needs (03/29/2022)   PRAPARE - Administrator, Civil Service (Medical): No    Lack of Transportation (Non-Medical): No  Physical Activity: Sufficiently Active (03/29/2022)   Exercise Vital Sign    Days of Exercise per Week: 2 days    Minutes of Exercise per Session: 150+ min  Stress: No Stress Concern Present (03/29/2022)   [employer] of Occupational Health - Occupational Stress Questionnaire    Feeling of Stress : Not at all  Social Connections: Unknown (03/29/2022)   Social Connection and Isolation Panel    Frequency of Communication with Friends and Family: More than three times a week    Frequency of Social Gatherings with Friends and Family: More than three times a week    Attends Religious Services: More than 4 times per year    Active Member of [employer] or Organizations: Yes    Attends Banker Meetings: More than 4 times per year    Marital Status: Not on file  Intimate Partner Violence: Not At Risk (03/29/2022)   Humiliation, Afraid, Rape, and Kick questionnaire    Fear of Current or Ex-Partner: No    Emotionally Abused: No    Physically Abused: No  Sexually Abused: No    Family History  Problem Relation Age of Onset   Thyroid  disease Mother    Arthritis Mother    Cancer Father        lung ca , cad, smoker   Heart disease Father     Current Outpatient Medications on File Prior to Visit  Medication Sig Dispense Refill   rosuvastatin  (CRESTOR ) 10 MG tablet TAKE 1 TABLET (10 MG TOTAL) BY MOUTH 2 (TWO) TIMES A WEEK. TAKE 10 MG BY MOUTH TWICE A WEEK. 24 tablet 3   No current facility-administered medications on file prior to visit.    Allergies  Allergen Reactions   Penicillins Rash        Physical Exam Vitals requested from patient and listed below if patient had equipment and was able to obtain at home for this virtual visit: There were no vitals filed for this visit. Estimated body mass index is 19.68 kg/m as calculated from the following:   Height as of 06/08/23: 5' 5 (1.651 m).   Weight as of 06/08/23: 118 lb 4 oz (53.6 kg).  EKG (optional): deferred due to virtual visit  GENERAL: alert, oriented, no acute distress detected, full vision exam deferred due to pandemic and/or virtual encounter  HEENT: atraumatic, conjunttiva clear, no obvious abnormalities on inspection of external nose and ears  NECK: normal movements of the head and neck  LUNGS: on inspection no signs of respiratory distress, breathing rate appears normal, no obvious gross SOB, gasping or wheezing  CV: no obvious cyanosis  MS: moves all visible extremities without noticeable abnormality  PSYCH/NEURO: pleasant and cooperative, no obvious depression or anxiety, speech and thought processing grossly intact, Cognitive function grossly intact  Flowsheet Row Office Visit from 11/02/2022 in [facility] [place]  PHQ-9 Total Score 1        06/21/2024    4:13 PM 05/19/2023    5:29 PM 11/02/2022    4:06 PM 03/29/2022    8:22 AM 03/16/2022    8:40 AM  Depression screen PHQ 2/9  Decreased Interest 0 0 0 0 0  Down, Depressed, Hopeless 0 0 0 0 0  PHQ - 2 Score 0 0 0 0 0  Altered sleeping   1 0 2  Tired, decreased energy   0 0 0  Change in appetite   0 0 0  Feeling bad or failure about yourself    0 0 0  Trouble concentrating   0 0 0  Moving slowly or fidgety/restless    0 0  Suicidal thoughts   0 0 0  PHQ-9 Score   1 0 2  Difficult doing work/chores   Not difficult at all Not difficult at all Not difficult at all       03/16/2022    8:40 AM 03/29/2022    8:25 AM 11/02/2022    4:05 PM 05/19/2023    5:30 PM 06/21/2024    4:13 PM  Fall Risk  Falls in the past year? 0 0 0 0  0  Was there an injury with Fall? 0 0 0 0 0  Fall Risk Category Calculator 0 0 0 0 0  Fall Risk Category (Retired) Low  Low      (RETIRED) Patient Fall Risk Level Low fall risk  Low fall risk      Patient at Risk for Falls Due to No Fall Risks No Fall Risks Other (Comment) No Fall Risks   Fall risk Follow up Falls  evaluation completed   Falls evaluation completed Education provided      Data saved with a previous flowsheet row definition     SUMMARY AND PLAN:  Encounter for Medicare annual wellness exam  Estrogen deficiency - Plan: DG Bone Density  Discussed applicable health maintenance/preventive health measures and advised and referred or ordered per patient preferences: -ordered bone density test, discussed adequate Vit D and Calcium  and supplementation, discussed bone building exercise -discussed vaccines due, she says she had the shingles vaccine and agrees to get record, advised to let us  know when/if she gets the others so that we can update her record here Health Maintenance  Topic Date Due   Zoster Vaccines- Shingrix (1 of 2) Never done   COVID-19 Vaccine (2 - Moderna risk series) 12/13/2019   Influenza Vaccine  05/04/2024   Mammogram  12/07/2024   Medicare Annual Wellness (AWV)  06/21/2025   Pneumococcal Vaccine: 50+ Years  Completed   DEXA SCAN  Completed   Hepatitis C Screening  Completed   HPV VACCINES  Aged Out   Meningococcal B Vaccine  Aged Out   DTaP/Tdap/Td  Discontinued   Colonoscopy  Discontinued   Fecal DNA (Cologuard)  Discontinued      Education and counseling on the following was provided based on the above review of health and a plan/checklist for the patient, along with additional information discussed, was provided for the patient in the patient instructions :  -Advised and counseled on a healthy lifestyle  -Reviewed patient's current diet. Advised and counseled on a whole foods based healthy diet. A summary of a healthy diet was provided in the  Patient Instructions.  -reviewed patient's current physical activity level and discussed exercise guidelines for adults. Discussed community ideas for safe exercise at home to assist in meeting exercise guideline recommendations in a safe and healthy way.  -Advise yearly dental visits at minimum and regular eye exams   Follow up: see patient instructions     Patient Instructions  I really enjoyed getting to talk with you today! I am available on Tuesdays and Thursdays for virtual visits if you have any questions or concerns, or if I can be of any further assistance.   CHECKLIST FROM ANNUAL WELLNESS VISIT:  -Follow up (please call to schedule if not scheduled after visit):   -yearly for annual wellness visit with primary care office  Here is a list of your preventive care/health maintenance measures and the plan for each if any are due:  PLAN For any measures below that may be due:    1. Ordered the bone density test today. If you have not been contacted for scheduling in the next 1-2 weeks, please call our office to check on this: [phone].  If abnormal, will advise appointment with Dr. [place]il[place] to discuss treatment options.    2. Can get vaccines at the pharmacy. Please let us  know if you do so that we can update your record.   Health Maintenance  Topic Date Due   Zoster Vaccines- Shingrix (1 of 2) Never done   COVID-19 Vaccine (2 - Moderna risk series) 12/13/2019   Influenza Vaccine  05/04/2024   Medicare Annual Wellness (AWV)  05/18/2024   Mammogram  12/07/2024   Pneumococcal Vaccine: 50+ Years  Completed   DEXA SCAN  Completed   Hepatitis C Screening  Completed   HPV VACCINES  Aged Out   Meningococcal B Vaccine  Aged Out   DTaP/Tdap/Td  Discontinued   Colonoscopy  Discontinued  Fecal DNA (Cologuard)  Discontinued    -See a dentist at least yearly  -Get your eyes checked and then per your eye specialist's recommendations  -Other issues addressed today:   -I  have included below further information regarding a healthy whole foods based diet, physical activity guidelines for adults, stress management and opportunities for social connections. I hope you find this information useful.   -----------------------------------------------------------------------------------------------------------------------------------------------------------------------------------------------------------------------------------------------------------    NUTRITION: -eat real food: lots of colorful vegetables (half the plate) and fruits -5-7 servings of vegetables and fruits per day (fresh or steamed is best), exp. 2 servings of vegetables with lunch and dinner and 2 servings of fruit per day. Berries and greens such as kale and collards are great choices.  -consume on a regular basis:  fresh fruits, fresh veggies, fish, nuts, seeds, healthy oils (such as olive oil, avocado oil), whole grains (make sure for bread/pasta/crackers/etc., that the first ingredient on label contains the word whole), legumes. -can eat small amounts of dairy and lean meat (no larger than the palm of your hand), but avoid processed meats such as ham, bacon, lunch meat, etc. -drink water -try to avoid fast food and pre-packaged foods, processed meat, ultra processed foods/beverages (donuts, candy, etc.) -most experts advise limiting sodium to < 2300mg  per day, should limit further is any chronic conditions such as high blood pressure, heart disease, diabetes, etc. The American Heart Association advised that < 1500mg  is is ideal -try to avoid foods/beverages that contain any ingredients with names you do not recognize  -try to avoid foods/beverages  with added sugar or sweeteners/sweets  -try to avoid sweet drinks (including diet drinks): soda, juice, Gatorade, sweet tea, power drinks, diet drinks -try to avoid white rice, white bread, pasta (unless whole grain)  EXERCISE GUIDELINES FOR ADULTS: -if  you wish to increase your physical activity, do so gradually and with the approval of your doctor -STOP and seek medical care immediately if you have any chest pain, chest discomfort or trouble breathing when starting or increasing exercise  -move and stretch your body, legs, feet and arms when sitting for long periods -Physical activity guidelines for optimal health in adults: -get at least 150 minutes per week of moderate exercise (can talk, but not sing); this is about 20-30 minutes of sustained activity 5-7 days per week or two 10-15 minute episodes of sustained activity 5-7 days per week -do some muscle building/resistance training/strength training at least 2 days per week  -balance exercises 3+ days per week:   Stand somewhere where you have something sturdy to hold onto if you lose balance    1) lift up on toes, then back down, start with 5x per day and work up to 20x   2) stand and lift one leg straight out to the side so that foot is a few inches of the floor, start with 5x each side and work up to 20x each side   3) stand on one foot, start with 5 seconds each side and work up to 20 seconds on each side  If you need ideas or help with getting more active:  -Silver sneakers https://tools.silversneakers.com  -Walk with a Doc: http://www.duncan-williams.com/  -try to include resistance (weight lifting/strength building) and balance exercises twice per week: or the following link for ideas: http://castillo-powell.com/  BuyDucts.dk  STRESS MANAGEMENT: -can try meditating, or just sitting quietly with deep breathing while intentionally relaxing all parts of your body for 5 minutes daily -if you need further help with stress, anxiety or  depression please follow up with your primary doctor or contact the wonderful folks at [facility]: [phone]  SOCIAL CONNECTIONS: -options in  [place] if you wish to engage in more social and exercise related activities:  -Silver sneakers https://tools.silversneakers.com  -Walk with a Doc: http://www.duncan-williams.com/  -Check out the [facility] Active Adults 50+ section on the [place] [employer] (hiking clubs, book clubs, cards and games, chess, exercise classes, aquatic classes and much more) - see the website for details: https://www.[place].gov/departments/parks-recreation/active-adults50  -YouTube has lots of exercise videos for different ages and abilities as well  -[facility] (a variety of indoor and outdoor inperson activities for adults). [phone]. [address]  -Virtual Online Classes (a variety of topics): see seniorplanet.org or call [phone]  -consider volunteering at a school, hospice center, church, senior center or elsewhere            Chiquita JONELLE Cramp, DO

## 2024-06-21 NOTE — Patient Instructions (Signed)
 I really enjoyed getting to talk with you today! I am available on Tuesdays and Thursdays for virtual visits if you have any questions or concerns, or if I can be of any further assistance.   CHECKLIST FROM ANNUAL WELLNESS VISIT:  -Follow up (please call to schedule if not scheduled after visit):   -yearly for annual wellness visit with primary care office  Here is a list of your preventive care/health maintenance measures and the plan for each if any are due:  PLAN For any measures below that may be due:    1. Ordered the bone density test today. If you have not been contacted for scheduling in the next 1-2 weeks, please call our office to check on this: [phone].  If abnormal, will advise appointment with Dr. Swaziland to discuss treatment options.    2. Can get vaccines at the pharmacy. Please let us  know if you do so that we can update your record.   Health Maintenance  Topic Date Due   Zoster Vaccines- Shingrix (1 of 2) Never done   COVID-19 Vaccine (2 - Moderna risk series) 12/13/2019   Influenza Vaccine  05/04/2024   Medicare Annual Wellness (AWV)  05/18/2024   Mammogram  12/07/2024   Pneumococcal Vaccine: 50+ Years  Completed   DEXA SCAN  Completed   Hepatitis C Screening  Completed   HPV VACCINES  Aged Out   Meningococcal B Vaccine  Aged Out   DTaP/Tdap/Td  Discontinued   Colonoscopy  Discontinued   Fecal DNA (Cologuard)  Discontinued    -See a dentist at least yearly  -Get your eyes checked and then per your eye specialist's recommendations  -Other issues addressed today:   -I have included below further information regarding a healthy whole foods based diet, physical activity guidelines for adults, stress management and opportunities for social connections. I hope you find this information useful.    -----------------------------------------------------------------------------------------------------------------------------------------------------------------------------------------------------------------------------------------------------------    NUTRITION: -eat real food: lots of colorful vegetables (half the plate) and fruits -5-7 servings of vegetables and fruits per day (fresh or steamed is best), exp. 2 servings of vegetables with lunch and dinner and 2 servings of fruit per day. Berries and greens such as kale and collards are great choices.  -consume on a regular basis:  fresh fruits, fresh veggies, fish, nuts, seeds, healthy oils (such as olive oil, avocado oil), whole grains (make sure for bread/pasta/crackers/etc., that the first ingredient on label contains the word whole), legumes. -can eat small amounts of dairy and lean meat (no larger than the palm of your hand), but avoid processed meats such as ham, bacon, lunch meat, etc. -drink water -try to avoid fast food and pre-packaged foods, processed meat, ultra processed foods/beverages (donuts, candy, etc.) -most experts advise limiting sodium to < 2300mg  per day, should limit further is any chronic conditions such as high blood pressure, heart disease, diabetes, etc. The American Heart Association advised that < 1500mg  is is ideal -try to avoid foods/beverages that contain any ingredients with names you do not recognize  -try to avoid foods/beverages  with added sugar or sweeteners/sweets  -try to avoid sweet drinks (including diet drinks): soda, juice, Gatorade, sweet tea, power drinks, diet drinks -try to avoid white rice, white bread, pasta (unless whole grain)  EXERCISE GUIDELINES FOR ADULTS: -if you wish to increase your physical activity, do so gradually and with the approval of your doctor -STOP and seek medical care immediately if you have any chest pain, chest discomfort or trouble breathing  when starting or  increasing exercise  -move and stretch your body, legs, feet and arms when sitting for long periods -Physical activity guidelines for optimal health in adults: -get at least 150 minutes per week of moderate exercise (can talk, but not sing); this is about 20-30 minutes of sustained activity 5-7 days per week or two 10-15 minute episodes of sustained activity 5-7 days per week -do some muscle building/resistance training/strength training at least 2 days per week  -balance exercises 3+ days per week:   Stand somewhere where you have something sturdy to hold onto if you lose balance    1) lift up on toes, then back down, start with 5x per day and work up to 20x   2) stand and lift one leg straight out to the side so that foot is a few inches of the floor, start with 5x each side and work up to 20x each side   3) stand on one foot, start with 5 seconds each side and work up to 20 seconds on each side  If you need ideas or help with getting more active:  -Silver sneakers https://tools.silversneakers.com  -Walk with a Doc: http://www.duncan-williams.com/  -try to include resistance (weight lifting/strength building) and balance exercises twice per week: or the following link for ideas: http://castillo-powell.com/  BuyDucts.dk  STRESS MANAGEMENT: -can try meditating, or just sitting quietly with deep breathing while intentionally relaxing all parts of your body for 5 minutes daily -if you need further help with stress, anxiety or depression please follow up with your primary doctor or contact the wonderful folks at [facility]: [phone]  SOCIAL CONNECTIONS: -options in [place] if you wish to engage in more social and exercise related activities:  -Silver sneakers https://tools.silversneakers.com  -Walk with a Doc: http://www.duncan-williams.com/  -Check out the [facility] Active Adults 50+  section on the [place] of [employer] (hiking clubs, book clubs, cards and games, chess, exercise classes, aquatic classes and much more) - see the website for details: https://www.[place].gov/departments/parks-recreation/active-adults50  -YouTube has lots of exercise videos for different ages and abilities as well  -[facility] (a variety of indoor and outdoor inperson activities for adults). [phone]. [address]  -Virtual Online Classes (a variety of topics): see seniorplanet.org or call [phone]  -consider volunteering at a school, hospice center, church, senior center or elsewhere

## 2024-06-22 DIAGNOSIS — J209 Acute bronchitis, unspecified: Secondary | ICD-10-CM | POA: Diagnosis not present

## 2024-06-25 DIAGNOSIS — J209 Acute bronchitis, unspecified: Secondary | ICD-10-CM | POA: Diagnosis not present

## 2024-06-28 ENCOUNTER — Inpatient Hospital Stay
Admission: RE | Admit: 2024-06-28 | Discharge: 2024-06-28 | Disposition: A | Discharge status: Home / Self Care | Source: Ambulatory Visit | Attending: Family Medicine | Admitting: Family Medicine

## 2024-06-28 DIAGNOSIS — E2839 Other primary ovarian failure: Secondary | ICD-10-CM | POA: Diagnosis not present

## 2024-07-05 ENCOUNTER — Ambulatory Visit: Payer: Self-pay | Admitting: *Deleted

## 2024-07-09 ENCOUNTER — Ambulatory Visit: Payer: Self-pay | Admitting: Family Medicine

## 2024-07-09 ENCOUNTER — Ambulatory Visit (INDEPENDENT_AMBULATORY_CARE_PROVIDER_SITE_OTHER): Admitting: Family Medicine

## 2024-07-09 ENCOUNTER — Encounter: Payer: Self-pay | Admitting: Family Medicine

## 2024-07-09 VITALS — BP 118/70 | HR 54 | Temp 97.9°F | Resp 16 | Ht 65.0 in | Wt 123.0 lb

## 2024-07-09 DIAGNOSIS — E785 Hyperlipidemia, unspecified: Secondary | ICD-10-CM | POA: Diagnosis not present

## 2024-07-09 DIAGNOSIS — E038 Other specified hypothyroidism: Secondary | ICD-10-CM

## 2024-07-09 DIAGNOSIS — M816 Localized osteoporosis [Lequesne]: Secondary | ICD-10-CM | POA: Diagnosis not present

## 2024-07-09 LAB — COMPREHENSIVE METABOLIC PANEL WITH GFR
ALT: 12 U/L (ref 0–35)
AST: 17 U/L (ref 0–37)
Albumin: 4.4 g/dL (ref 3.5–5.2)
Alkaline Phosphatase: 42 U/L (ref 39–117)
BUN: 18 mg/dL (ref 6–23)
CO2: 27 meq/L (ref 19–32)
Calcium: 10 mg/dL (ref 8.4–10.5)
Chloride: 106 meq/L (ref 96–112)
Creatinine, Ser: 0.82 mg/dL (ref 0.40–1.20)
GFR: 69.33 mL/min (ref >60.00)
Glucose, Bld: 59 mg/dL — ABNORMAL LOW (ref 70–99)
Potassium: 4.3 meq/L (ref 3.5–5.1)
Sodium: 143 meq/L (ref 135–145)
Total Bilirubin: 0.7 mg/dL (ref 0.2–1.2)
Total Protein: 7.1 g/dL (ref 6.0–8.3)

## 2024-07-09 LAB — TSH: TSH: 2.11 u[IU]/mL (ref 0.35–5.50)

## 2024-07-09 LAB — LIPID PANEL
Cholesterol: 158 mg/dL (ref 0–200)
HDL: 73.9 mg/dL (ref >39.00)
LDL Cholesterol: 71 mg/dL (ref 0–99)
NonHDL: 83.79
Total CHOL/HDL Ratio: 2
Triglycerides: 63 mg/dL (ref 0.0–149.0)
VLDL: 12.6 mg/dL (ref 0.0–40.0)

## 2024-07-09 LAB — VITAMIN D 25 HYDROXY (VIT D DEFICIENCY, FRACTURES): VITD: 30.5 ng/mL (ref 30.00–100.00)

## 2024-07-09 MED ORDER — ALENDRONATE SODIUM 70 MG PO TABS: 70.0000 mg | ORAL_TABLET | ORAL | 3 refills | Status: AC

## 2024-07-09 NOTE — Assessment & Plan Note (Signed)
 We discussed recent DEXA results and treatment options. She agrees with starting Fosamax  70 mg weekly, discussed some side effects. Continue adequate calcium  and vitamin D  supplementation. Continue fall prevention. Recommend weightbearing exercises 3 times per week. DEXA can be repeated in 2 to 3 years.

## 2024-07-09 NOTE — Progress Notes (Signed)
 Chief Complaint  Patient presents with   Medical Management of Chronic Issues    Follow up to discuss Bone Density results   Discussed the use of AI scribe software for clinical note transcription with the patient, who gave verbal consent to proceed.  History of Present Illness [name] is a 78 year old female with past medical history significant for subclinical hypothyroidism, hyperlipidemia, and vein disease who presents for follow-up and to discuss bone density results. Last seen on 06/08/23.  DEXA ordered during her last Medicare visit, 06/21/2024, showed osteopenia of the hips and osteoporosis of distal radius. She has not previously tried medication for osteoporosis and is currently taking calcium  with vitamin D  supplements. She recently started taking calcium  tabs.    Lumbar spine L1-L4 Femoral neck (FN) 33% distal radius Ultra distal radius  T-score -0.7 RFN: -1.6 LFN: -1.8 -3.5 -2.5   She engages in regular physical activity, including walking and upper arm exercises. No recent falls have occurred. She recently had a dental visit, she does not need any procedure, she follows every 6 months.  -She experiences intermittent right knee pain over the past few months, it was worse yesterday, lateral aspect. No hx of trauma but has been moving items up and down stairs, planning on moving to a new place. No hx of trauma. Has not noted erythema. The pain is described as intermittent, with occasional swelling and crackling sounds when bending the knee.  Today she is not having pain. She has not tried OTC treatments.  Since her last visit she has seen ENT for vertigo,underwent successful vestibular therapy.  Also evaluated by audiologist, currently she is wearing hearing aids, which have helped.  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  Hyperlipidemia: Currently she is on rosuvastatin  10 mg 2 times per week. Lab  Results  Component Value Date   CHOL 150 06/13/2023   HDL 72.00 06/13/2023   LDLCALC 66 06/13/2023   LDLDIRECT 114.5 11/14/2012   TRIG 57.0 06/13/2023   CHOLHDL 2 06/13/2023   Subclinical hypothyroidism: Negative for abnormal weight changes, palpitations, abdominal pain, changes in bowel habits, tremor, or cold/heat intolerance.  Lab Results  Component Value Date   TSH 3.83 06/13/2023   Review of Systems  Constitutional:  Negative for activity change, appetite change, chills and fever.  HENT:  Negative for sore throat.   Respiratory:  Negative for cough, shortness of breath and wheezing.   Cardiovascular:  Negative for chest pain, palpitations and leg swelling.  Gastrointestinal:  Negative for nausea and vomiting.  Genitourinary:  Negative for decreased urine volume, dysuria and hematuria.  Skin:  Negative for rash.  Neurological:  Negative for syncope and weakness.  See other pertinent positives and negatives in HPI.  Current Outpatient Medications on File Prior to Visit  Medication Sig Dispense Refill   rosuvastatin  (CRESTOR ) 10 MG tablet TAKE 1 TABLET (10 MG TOTAL) BY MOUTH 2 (TWO) TIMES A WEEK. TAKE 10 MG BY MOUTH TWICE A WEEK. 24 tablet 3   No current facility-administered medications on file prior to visit.   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   Varicose veins    Allergies  Allergen Reactions   Penicillins Rash   Social History   Socioeconomic History   Marital status: Married    Spouse name:  Not on file   Number of children: 1   Years of education: Not on file   Highest education level: Not on file  Occupational History   Occupation: coordinator  Tobacco Use   Smoking status: Never   Smokeless tobacco: Never  Substance and Sexual Activity   Alcohol  use: Yes    Alcohol /week: 0.0 standard drinks of alcohol     Drug use: No   Sexual activity: Not on file  Other Topics Concern   Not on file  Social History Narrative   Not on file   Social Drivers of Health   Financial Resource Strain: Low Risk  (03/29/2022)   Overall Financial Resource Strain (CARDIA)    Difficulty of Paying Living Expenses: Not hard at all  Food Insecurity: No Food Insecurity (03/29/2022)   Hunger Vital Sign    Worried About Running Out of Food in the Last Year: Never true    Ran Out of Food in the Last Year: Never true  Transportation Needs: No Transportation Needs (03/29/2022)   PRAPARE - Administrator, Civil Service (Medical): No    Lack of Transportation (Non-Medical): No  Physical Activity: Sufficiently Active (03/29/2022)   Exercise Vital Sign    Days of Exercise per Week: 2 days    Minutes of Exercise per Session: 150+ min  Stress: No Stress Concern Present (03/29/2022)   [employer] of Occupational Health - Occupational Stress Questionnaire    Feeling of Stress : Not at all  Social Connections: Unknown (03/29/2022)   Social Connection and Isolation Panel    Frequency of Communication with Friends and Family: More than three times a week    Frequency of Social Gatherings with Friends and Family: More than three times a week    Attends Religious Services: More than 4 times per year    Active Member of [employer] or Organizations: Yes    Attends Banker Meetings: More than 4 times per year    Marital Status: Not on file   Vitals:   07/09/24 1017  BP: 118/70  Pulse: (!) 54  Resp: 16  Temp: 97.9 F (36.6 C)  SpO2: 96%   Body mass index is 20.47 kg/m.  Physical Exam Vitals and nursing note reviewed.  Constitutional:      General: She is not in acute distress.    Appearance: She is well-developed.  HENT:     Head: Normocephalic and atraumatic.     Mouth/Throat:     Mouth: Mucous membranes are moist.     Pharynx: Oropharynx is clear.  Eyes:     Conjunctiva/sclera:  Conjunctivae normal.  Cardiovascular:     Rate and Rhythm: Regular rhythm. Bradycardia present.     Pulses:          Dorsalis pedis pulses are 2+ on the right side and 2+ on the left side.     Heart sounds: No murmur heard. Pulmonary:     Effort: Pulmonary effort is normal. No respiratory distress.     Breath sounds: Normal breath sounds.  Abdominal:     Palpations: Abdomen is soft. There is no hepatomegaly or mass.     Tenderness: There is no abdominal tenderness.  Lymphadenopathy:     Cervical: No cervical adenopathy.  Skin:    General: Skin is warm.     Findings: No erythema or rash.  Neurological:     General: No focal deficit present.     Mental Status: She is alert and oriented to  person, place, and time.     Cranial Nerves: No cranial nerve deficit.     Gait: Gait normal.  Psychiatric:        Mood and Affect: Mood and affect normal.    ASSESSMENT AND PLAN:  Ms. [name] was seen today for medical management of chronic issues.  Diagnoses and all orders for this visit:  Orders Placed This Encounter  Procedures   Comprehensive metabolic panel with GFR   TSH   VITAMIN D  25 Hydroxy (Vit-D Deficiency, Fractures)   Lipid panel   Lab Results  Component Value Date   VD25OH 30.50 07/09/2024   Lab Results  Component Value Date   TSH 2.11 07/09/2024   Lab Results  Component Value Date   NA 143 07/09/2024   CL 106 07/09/2024   K 4.3 07/09/2024   CO2 27 07/09/2024   BUN 18 07/09/2024   CREATININE 0.82 07/09/2024   GFR 69.33 07/09/2024   CALCIUM  10.0 07/09/2024   ALBUMIN 4.4 07/09/2024   GLUCOSE 59 (L) 07/09/2024   Lab Results  Component Value Date   ALT 12 07/09/2024   AST 17 07/09/2024   ALKPHOS 42 07/09/2024   BILITOT 0.7 07/09/2024   Lab Results  Component Value Date   CHOL 158 07/09/2024   HDL 73.90 07/09/2024   LDLCALC 71 07/09/2024   LDLDIRECT 114.5 11/14/2012   TRIG 63.0 07/09/2024   CHOLHDL 2 07/09/2024   Localized osteoporosis  without current pathological fracture Assessment & Plan: We discussed recent DEXA results and treatment options. She agrees with starting Fosamax  70 mg weekly, discussed some side effects. Continue adequate calcium  and vitamin D  supplementation. Continue fall prevention. Recommend weightbearing exercises 3 times per week. DEXA can be repeated in 2 to 3 years.  Orders: -     Alendronate  Sodium; Take 1 tablet (70 mg total) by mouth every 7 (seven) days. Take with a full glass of water on an empty stomach.  Dispense: 13 tablet; Refill: 3 -     VITAMIN D  25 Hydroxy (Vit-D Deficiency, Fractures); Future  Subclinical hypothyroidism Assessment & Plan: Last TSH normal at 3.83 in 06/2023. She is not on hormonal therapy. Further recommendation will be given according to TSI result.  Orders: -     TSH; Future  Hyperlipidemia, unspecified hyperlipidemia type Assessment & Plan: Problem has been well-controlled. Continue rosuvastatin  10 mg twice per week as well as low-fat diet. Further recommendation will be given according to lipid panel result.  Orders: -     Comprehensive metabolic panel with GFR; Future -     Lipid panel; Future  Return in about 1 year (around 07/09/2025) for CPE.  Dushaun Okey G. [place], MD  [facility]. [place] office.

## 2024-07-09 NOTE — Patient Instructions (Addendum)
 A few things to remember from today's visit:  Localized osteoporosis without current pathological fracture - Plan: alendronate  (FOSAMAX ) 70 MG tablet, VITAMIN D  25 Hydroxy (Vit-D Deficiency, Fractures)  Subclinical hypothyroidism - Plan: TSH  Hyperlipidemia, unspecified hyperlipidemia type - Plan: Comprehensive metabolic panel with GFR, Lipid panel  Today we started Fosamax  once per week. Rest unchanged.  If you need refills for medications you take chronically, please call your pharmacy. Do not use My Chart to request refills or for acute issues that need immediate attention. If you send a my chart message, it may take a few days to be addressed, specially if I am not in the office.  Please be sure medication list is accurate. If a new problem present, please set up appointment sooner than planned today.

## 2024-07-09 NOTE — Assessment & Plan Note (Signed)
 Last TSH normal at 3.83 in 06/2023. She is not on hormonal therapy. Further recommendation will be given according to TSI result.

## 2024-07-09 NOTE — Assessment & Plan Note (Signed)
 Problem has been well-controlled. Continue rosuvastatin  10 mg twice per week as well as low-fat diet. Further recommendation will be given according to lipid panel result.

## 2024-07-18 ENCOUNTER — Ambulatory Visit: Payer: Self-pay

## 2024-07-18 DIAGNOSIS — R1032 Left lower quadrant pain: Secondary | ICD-10-CM | POA: Diagnosis not present

## 2024-07-18 NOTE — Telephone Encounter (Signed)
 FYI Only or Action Required?: FYI only for provider.  Patient was last seen in primary care on 07/09/2024 by [place], Betty G, MD.  Called Nurse Triage reporting Abdominal Pain.  Symptoms began yesterday.  Interventions attempted: OTC medications: Tylenol and Prescription medications: Meclizine.  Symptoms are: gradually worsening.  Triage Disposition: See Physician Within 24 Hours  Patient/caregiver understands and will follow disposition?: Yes        Copied from CRM #[id number]. Topic: Clinical - Red Word Triage >> Jul 18, 2024  1:55 PM Mesmerise C wrote: [facility] that prompted transfer to Nurse Triage: Patient's stated she's not feeling well, stomach is painful since yesterday, and headaches as well with nausea Reason for Disposition  [1] MODERATE pain (e.g., interferes with normal activities) AND [2] pain comes and goes (cramps) AND [3] present > 24 hours  (Exception: Pain with Vomiting or Diarrhea - see that Guideline.)  Answer Assessment - Initial Assessment Questions 1. LOCATION: Where does it hurt?      Abdominal area all over  2. RADIATION: Does the pain shoot anywhere else? (e.g., chest, back)     Denies  3. ONSET: When did the pain begin? (e.g., minutes, hours or days ago)      Monday morning  4. PATTERN Does the pain come and go, or is it constant?    Constant  5. SEVERITY: How bad is the pain?  (e.g., Scale 1-10; mild, moderate, or severe)     5/10  6. CAUSE: What do you think is causing the stomach pain? (e.g., gallstones, recent abdominal surgery)     Constipation  7. OTHER SYMPTOMS: Do you have any other symptoms? (e.g., back pain, diarrhea, fever, urination pain, vomiting)       Constipation, nausea and headaches    Patient taking Meclizine and tylenol for symptoms.  She states the pain feels like bad gas pains.  Protocols used: Abdominal Pain - Female-A-AH

## 2024-07-19 ENCOUNTER — Ambulatory Visit: Admitting: Family Medicine

## 2024-08-09 DIAGNOSIS — R42 Dizziness and giddiness: Secondary | ICD-10-CM | POA: Diagnosis not present

## 2024-09-06 DIAGNOSIS — R42 Dizziness and giddiness: Secondary | ICD-10-CM | POA: Diagnosis not present

## 2024-09-07 DIAGNOSIS — J209 Acute bronchitis, unspecified: Secondary | ICD-10-CM | POA: Diagnosis not present

## 2024-09-10 ENCOUNTER — Telehealth: Payer: Self-pay | Admitting: Family Medicine

## 2024-09-10 DIAGNOSIS — J209 Acute bronchitis, unspecified: Secondary | ICD-10-CM | POA: Diagnosis not present

## 2024-09-10 NOTE — Telephone Encounter (Unsigned)
 Copied from CRM [id number]. Topic: Clinical - Medication Refill >> Sep 10, 2024 10:00 AM Sophia H wrote: Medication: rosuvastatin  (CRESTOR ) 10 MG tablet   Has the patient contacted their pharmacy? Yes, new RX needed  This is the patient's preferred pharmacy:   [id number] [place], [place] - [address] [place] [address] Phone: [phone] Fax: [phone]  Is this the correct pharmacy for this prescription? Yes If no, delete pharmacy and type the correct one.   Has the prescription been filled recently? Yes  Is the patient out of the medication? Yes  Has the patient been seen for an appointment in the last year OR does the patient have an upcoming appointment? Yes, seen back in October   Can we respond through MyChart? Yes  Agent: Please be advised that Rx refills may take up to 3 business days. We ask that you follow-up with your pharmacy.

## 2024-09-11 MED ORDER — ROSUVASTATIN CALCIUM 10 MG PO TABS: 10.0000 mg | ORAL_TABLET | ORAL | 3 refills | Status: AC

## 2024-09-11 NOTE — Telephone Encounter (Signed)
 Patient will need to find new PCP for future refills.

## 2024-09-14 DIAGNOSIS — R42 Dizziness and giddiness: Secondary | ICD-10-CM | POA: Diagnosis not present
